# Patient Record
Sex: Male | Born: 1945 | Race: White | Hispanic: No | Marital: Married | Smoking: Current every day smoker
Health system: Southern US, Community
[De-identification: ages and names within clinical notes are randomized; demographics above are authoritative.]

## PROBLEM LIST (undated history)

## (undated) DIAGNOSIS — G20A1 Parkinson's disease without dyskinesia, without mention of fluctuations: Secondary | ICD-10-CM

## (undated) DIAGNOSIS — G20 Parkinson's disease: Secondary | ICD-10-CM

## (undated) DIAGNOSIS — I10 Essential (primary) hypertension: Secondary | ICD-10-CM

## (undated) DIAGNOSIS — J449 Chronic obstructive pulmonary disease, unspecified: Secondary | ICD-10-CM

## (undated) DIAGNOSIS — M1990 Unspecified osteoarthritis, unspecified site: Secondary | ICD-10-CM

## (undated) DIAGNOSIS — F4310 Post-traumatic stress disorder, unspecified: Secondary | ICD-10-CM

## (undated) DIAGNOSIS — E785 Hyperlipidemia, unspecified: Secondary | ICD-10-CM

## (undated) HISTORY — PX: BACK SURGERY: SHX140

---

## 2019-04-24 ENCOUNTER — Emergency Department (HOSPITAL_BASED_OUTPATIENT_CLINIC_OR_DEPARTMENT_OTHER)
Admission: EM | Admit: 2019-04-24 | Discharge: 2019-04-24 | Disposition: A | Discharge status: Home / Self Care | Payer: No Typology Code available for payment source | Attending: Emergency Medicine | Admitting: Emergency Medicine

## 2019-04-24 ENCOUNTER — Emergency Department (HOSPITAL_BASED_OUTPATIENT_CLINIC_OR_DEPARTMENT_OTHER): Payer: No Typology Code available for payment source

## 2019-04-24 ENCOUNTER — Encounter (HOSPITAL_BASED_OUTPATIENT_CLINIC_OR_DEPARTMENT_OTHER): Payer: Self-pay | Admitting: Oncology

## 2019-04-24 ENCOUNTER — Other Ambulatory Visit: Payer: Self-pay

## 2019-04-24 DIAGNOSIS — J449 Chronic obstructive pulmonary disease, unspecified: Secondary | ICD-10-CM | POA: Insufficient documentation

## 2019-04-24 DIAGNOSIS — I10 Essential (primary) hypertension: Secondary | ICD-10-CM | POA: Diagnosis not present

## 2019-04-24 DIAGNOSIS — F17210 Nicotine dependence, cigarettes, uncomplicated: Secondary | ICD-10-CM | POA: Insufficient documentation

## 2019-04-24 DIAGNOSIS — M25531 Pain in right wrist: Secondary | ICD-10-CM | POA: Insufficient documentation

## 2019-04-24 HISTORY — DX: Post-traumatic stress disorder, unspecified: F43.10

## 2019-04-24 HISTORY — DX: Unspecified osteoarthritis, unspecified site: M19.90

## 2019-04-24 HISTORY — DX: Hyperlipidemia, unspecified: E78.5

## 2019-04-24 HISTORY — DX: Chronic obstructive pulmonary disease, unspecified: J44.9

## 2019-04-24 HISTORY — DX: Parkinson's disease without dyskinesia, without mention of fluctuations: G20.A1

## 2019-04-24 HISTORY — DX: Essential (primary) hypertension: I10

## 2019-04-24 HISTORY — DX: Parkinson's disease: G20

## 2019-04-24 MED ORDER — KETOROLAC TROMETHAMINE 15 MG/ML IJ SOLN
15.0000 mg | Freq: Once | INTRAMUSCULAR | Status: AC
  Administered 2019-04-24: 15 mg via INTRAMUSCULAR
  Filled 2019-04-24: qty 1

## 2019-04-24 MED ORDER — ACETAMINOPHEN 500 MG PO TABS
1000.0000 mg | ORAL_TABLET | Freq: Once | ORAL | Status: AC
  Administered 2019-04-24: 1000 mg via ORAL
  Filled 2019-04-24: qty 2

## 2019-04-24 NOTE — ED Provider Notes (Signed)
[facility] EMERGENCY DEPARTMENT Provider Note   CSN: [id number] Arrival date & time: 04/24/19  1927     History Chief Complaint  Patient presents with  . Wrist Pain    Rickey Baker is a 74 y.o. male.  74 yo M with a chief complaint of right wrist pain.  Going on for about 4 days now.  No obvious trauma.  States that every once in a while he will bang against something due to his Parkinson's disease.  At 1 point it was swollen and reportedly red.  This is between the first and second metacarpal.  He applied warm compresses and that improved but he feels the pain is now localized more to the wrist joint.  Worse with movement and palpation and twisting.  Like he was having trouble holding his coffee cup today and so came to the ED for evaluation.  The history is provided by the patient.  Wrist Pain This is a new problem. The current episode started 2 days ago. The problem occurs constantly. The problem has not changed since onset.Pertinent negatives include no chest pain, no abdominal pain, no headaches and no shortness of breath. The symptoms are aggravated by bending and twisting. Nothing relieves the symptoms. He has tried nothing for the symptoms. The treatment provided no relief.       Past Medical History:  Diagnosis Date  . Arthritis   . COPD (chronic obstructive pulmonary disease) ([place])   . Hyperlipidemia   . Hypertension   . Parkinson's disease ([place])   . PTSD (post-traumatic stress disorder)     There are no problems to display for this patient.   Past Surgical History:  Procedure Laterality Date  . BACK SURGERY         No family history on file.  Social History   Tobacco Use  . Smoking status: Current Every Day Smoker    Packs/day: 1.00    Years: 50.00    Pack years: 50.00    Types: Cigarettes  . Smokeless tobacco: Never Used  Substance Use Topics  . Alcohol use: Not Currently  . Drug use: Not Currently    Home Medications Prior to  Admission medications   Not on File    Allergies    Patient has no known allergies.  Review of Systems   Review of Systems  Constitutional: Negative for chills and fever.  HENT: Negative for congestion and facial swelling.   Eyes: Negative for discharge and visual disturbance.  Respiratory: Negative for shortness of breath.   Cardiovascular: Negative for chest pain and palpitations.  Gastrointestinal: Negative for abdominal pain, diarrhea and vomiting.  Musculoskeletal: Positive for arthralgias. Negative for myalgias.  Skin: Negative for color change and rash.  Neurological: Negative for tremors, syncope and headaches.  Psychiatric/Behavioral: Negative for confusion and dysphoric mood.    Physical Exam Updated Vital Signs BP (!) 144/101 (BP Location: Right Arm)   Pulse 94   Temp 98 F (36.7 C) (Oral)   Resp 18   Ht 5\' 9"  (1.753 m)   Wt 81.2 kg   SpO2 96%   BMI 26.43 kg/m   Physical Exam Vitals and nursing note reviewed.  Constitutional:      Appearance: He is well-developed.  HENT:     Head: Normocephalic and atraumatic.  Eyes:     Pupils: Pupils are equal, round, and reactive to light.  Neck:     Vascular: No JVD.  Cardiovascular:     Rate and Rhythm: Normal  rate and regular rhythm.     Heart sounds: No murmur. No friction rub. No gallop.   Pulmonary:     Effort: No respiratory distress.     Breath sounds: No wheezing.  Abdominal:     General: There is no distension.     Tenderness: There is no guarding or rebound.  Musculoskeletal:        General: Normal range of motion.     Cervical back: Normal range of motion and neck supple.     Comments: Able to range the wrist without significant pain with limited motion.  Has some pain once again to about 45 degrees of dorsiflexion.  Some tenderness about the first and second metacarpal.  He has some subjective decrease sensation distal to the interphalangeal joints along the thumb.  Pulse motor and sensation are  otherwise intact.  There is no appreciable tenderness to the scaphoid.  There is no ligamentous laxity appreciated to the thumb.  Skin:    Coloration: Skin is not pale.     Findings: No rash.  Neurological:     Mental Status: He is alert and oriented to person, place, and time.  Psychiatric:        Behavior: Behavior normal.     ED Results / Procedures / Treatments   Labs (all labs ordered are listed, but only abnormal results are displayed) Labs Reviewed - No data to display  EKG None  Radiology DG Wrist Complete Right  Result Date: 04/24/2019 CLINICAL DATA:  Sudden onset right wrist pain several days ago, no known injury, initial encounter EXAM: RIGHT WRIST - COMPLETE 3+ VIEW COMPARISON:  None. FINDINGS: There is no evidence of fracture or dislocation. There is no evidence of arthropathy or other focal bone abnormality. Soft tissues are unremarkable. IMPRESSION: No acute abnormality noted. Electronically Signed   By: Alcide Clever M.D.   On: 04/24/2019 20:30   DG Hand Complete Right  Result Date: 04/24/2019 CLINICAL DATA:  Sudden onset right hand pain, no known injury, initial encounter EXAM: RIGHT HAND - COMPLETE 3+ VIEW COMPARISON:  None. FINDINGS: There is no evidence of fracture or dislocation. There is no evidence of arthropathy or other focal bone abnormality. Soft tissues are unremarkable. IMPRESSION: No acute abnormality noted. Electronically Signed   By: Alcide Clever M.D.   On: 04/24/2019 20:31    Procedures Procedures (including critical care time)  Medications Ordered in ED Medications  acetaminophen (TYLENOL) tablet 1,000 mg (1,000 mg Oral Given 04/24/19 2008)  ketorolac (TORADOL) 15 MG/ML injection 15 mg (15 mg Intramuscular Given 04/24/19 2009)    ED Course  I have reviewed the triage vital signs and the nursing notes.  Pertinent labs & imaging results that were available during my care of the patient were reviewed by me and considered in my medical decision making  (see chart for details).    MDM Rules/Calculators/A&P                      74 yo M with a chief complaint of right hand and wrist pain.  Going on for about 4 days now.  Started with some erythema and warmth but has improved.  Not warm on exam here no obvious edema clinically seems much less likely to be septic arthritis.  Will obtain screening plain films placed in a splint.  PCP follow-up.  Plain film viewed by me negative for fracture.  Discharge home.  8:48 PM:  I have discussed the diagnosis/risks/treatment options  with the patient and believe the pt to be eligible for discharge home to follow-up with PCP. We also discussed returning to the ED immediately if new or worsening sx occur. We discussed the sx which are most concerning (e.g., sudden worsening pain, fever, inability to tolerate by mouth) that necessitate immediate return. Medications administered to the patient during their visit and any new prescriptions provided to the patient are listed below.  Medications given during this visit Medications  acetaminophen (TYLENOL) tablet 1,000 mg (1,000 mg Oral Given 04/24/19 2008)  ketorolac (TORADOL) 15 MG/ML injection 15 mg (15 mg Intramuscular Given 04/24/19 2009)     The patient appears reasonably screen and/or stabilized for discharge and I doubt any other medical condition or other [facility] requiring further screening, evaluation, or treatment in the ED at this time prior to discharge.   Final Clinical Impression(s) / ED Diagnoses Final diagnoses:  Right wrist pain    Rx / DC Orders ED Discharge Orders    None       Melene Plan, DO 04/24/19 2048

## 2019-04-24 NOTE — Discharge Instructions (Signed)
Your x-ray was negative for fracture.  Wear the splint as needed for support and comfort.  Take Tylenol and ibuprofen as needed for pain.  Please return for redness fever or worsening pain.  Follow-up with your family doctor.

## 2019-04-24 NOTE — ED Triage Notes (Signed)
Pt c/o right wrist pain.  States pain started over the weekend.  Denies injury.  States he does fall often however does not remember falling over the weekend when the pain began.  Pt reports pain got progressively worse today causing him to vomit.  No obvious deformity.

## 2021-05-02 IMAGING — DX DG WRIST COMPLETE 3+V*R*
4 series · 4 of 4 positions shown · non-contrast
Comparison: None.

CLINICAL DATA: Sudden onset right wrist pain several days ago, no
known injury, initial encounter

EXAM:
RIGHT WRIST - COMPLETE 3+ VIEW

[wrist pa]
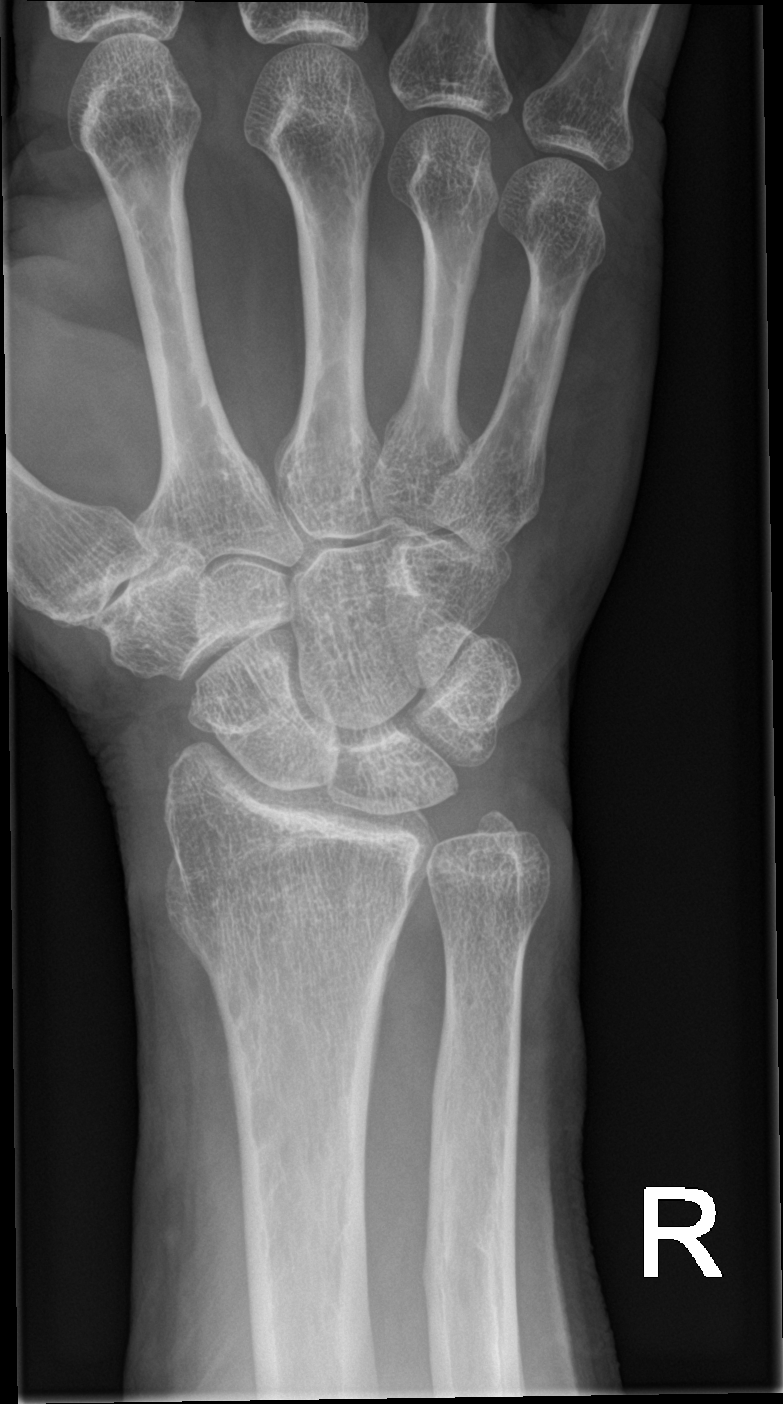

[wrist obl]
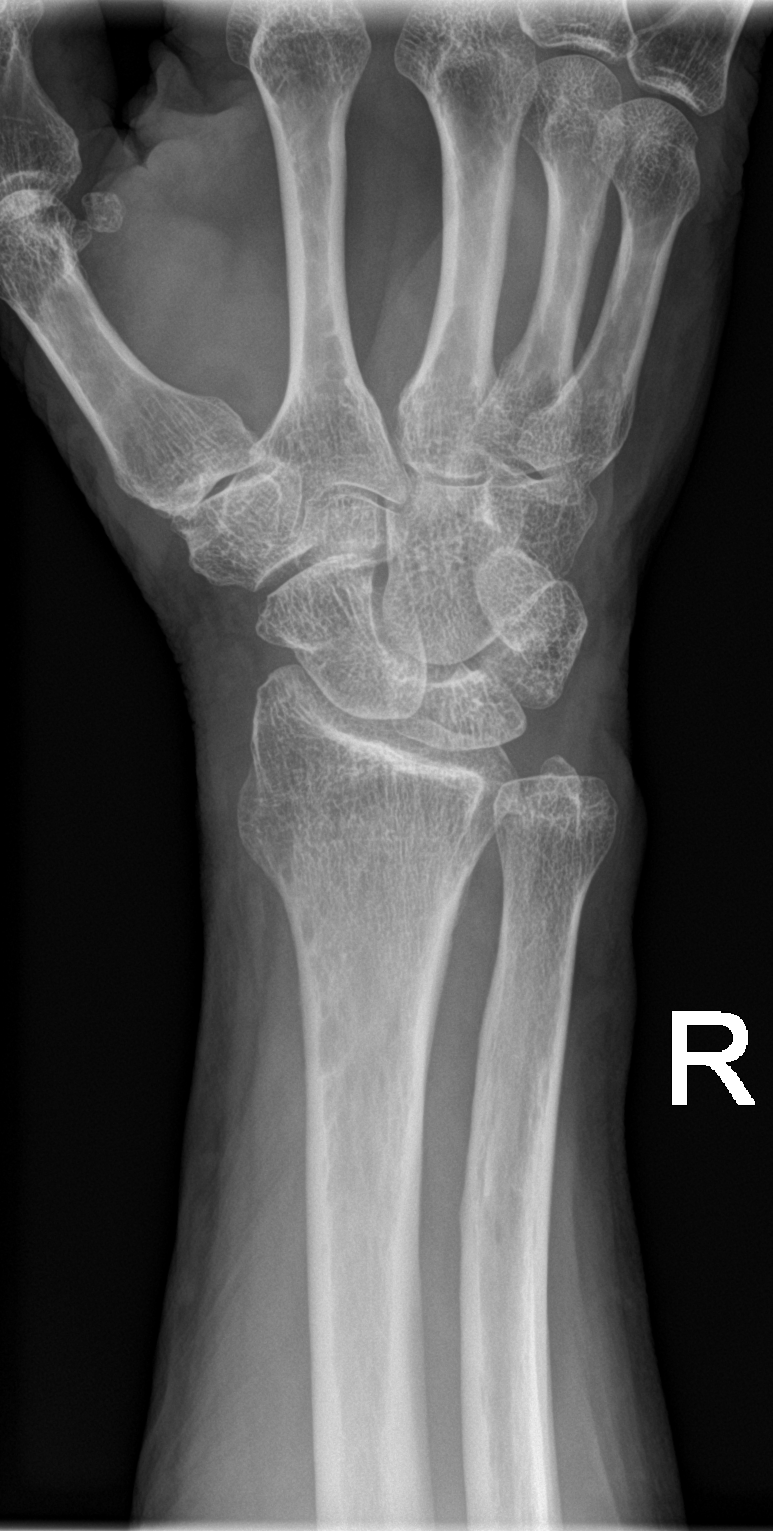

[wrist lat]
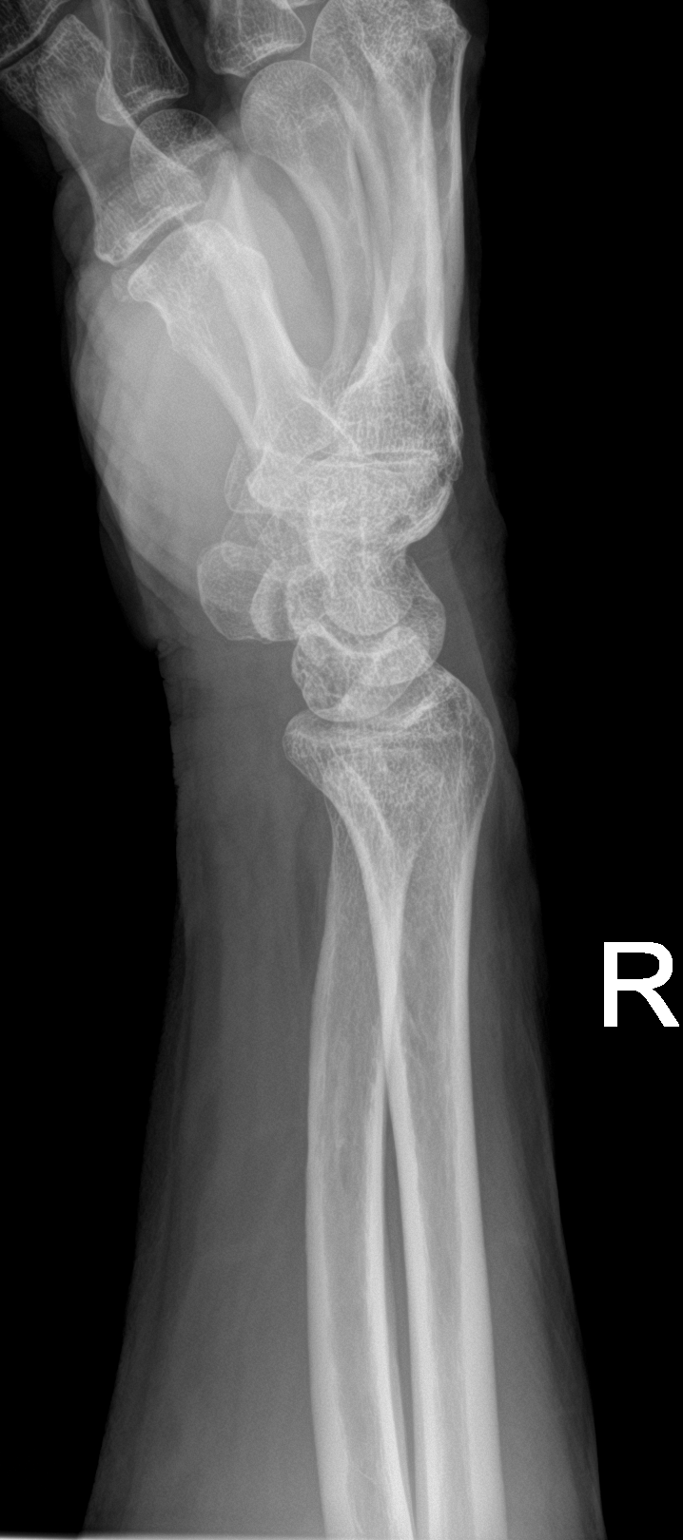

[wrist navicular]
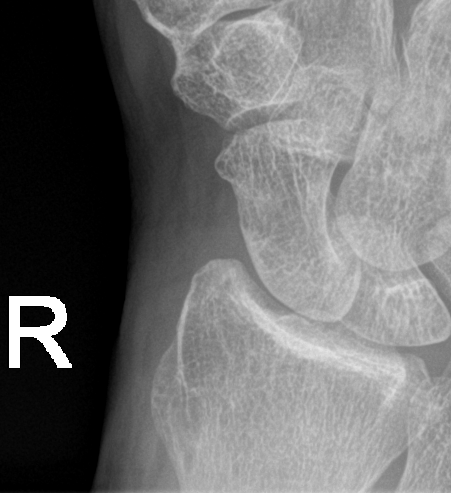

[4 of 4 positions shown; findings below may reference images not displayed]

FINDINGS: There is no evidence of fracture or dislocation. There is no
evidence of arthropathy or other focal bone abnormality. Soft
tissues are unremarkable.
IMPRESSION: No acute abnormality noted.

## 2021-07-21 ENCOUNTER — Encounter: Payer: Self-pay | Admitting: Internal Medicine

## 2021-07-21 ENCOUNTER — Ambulatory Visit (INDEPENDENT_AMBULATORY_CARE_PROVIDER_SITE_OTHER): Payer: No Typology Code available for payment source | Admitting: Internal Medicine

## 2021-07-21 VITALS — BP 124/78 | HR 101 | Temp 97.7°F | Ht 69.5 in | Wt 182.6 lb

## 2021-07-21 DIAGNOSIS — J449 Chronic obstructive pulmonary disease, unspecified: Secondary | ICD-10-CM | POA: Diagnosis not present

## 2021-07-21 DIAGNOSIS — F17210 Nicotine dependence, cigarettes, uncomplicated: Secondary | ICD-10-CM

## 2021-07-21 DIAGNOSIS — F17200 Nicotine dependence, unspecified, uncomplicated: Secondary | ICD-10-CM

## 2021-07-21 MED ORDER — BREZTRI AEROSPHERE 160-9-4.8 MCG/ACT IN AERO: 2.0000 | INHALATION_SPRAY | Freq: Two times a day (BID) | RESPIRATORY_TRACT | 5 refills | Status: DC

## 2021-07-21 MED ORDER — ALBUTEROL SULFATE (2.5 MG/3ML) 0.083% IN NEBU: 2.5000 mg | INHALATION_SOLUTION | Freq: Four times a day (QID) | RESPIRATORY_TRACT | 12 refills | Status: DC | PRN

## 2021-07-21 MED ORDER — PREDNISONE 20 MG PO TABS: 40.0000 mg | ORAL_TABLET | Freq: Every day | ORAL | 0 refills | Status: DC

## 2021-07-21 NOTE — Patient Instructions (Signed)
Please schedule follow up scheduled with myself in 3 months.  If my schedule is not open yet, we will contact you with a reminder closer to that time. Please call [phone] if you haven't heard from [place] a month before.   Stop stiolto inhaler. Switch to breztri inhaler 2 puffs twice a day.   I will send a portable nebulizer machine to a DME company to deliver to your house.   I will get your breathing testing records and CT scans from the [place].   What are the benefits of quitting smoking? Quitting smoking can lower your chances of getting or dying from heart disease, lung disease, kidney failure, infection, or cancer. It can also lower your chances of getting osteoporosis, a condition that makes your bones weak. Plus, quitting smoking can help your skin look younger and reduce the chances that you will have problems with sex.  Quitting smoking will improve your health no matter how old you are, and no matter how long or how much you have smoked.  What should I do if I want to quit smoking? The letters in the word "START" can help you remember the steps to take: S = Set a quit date. T = Tell family, friends, and the people around you that you plan to quit. A = Anticipate or plan ahead for the tough times you'll face while quitting. R = Remove cigarettes and other tobacco products from your home, car, and work. T = Talk to your doctor about getting help to quit.  How can my doctor or nurse help? Your doctor or nurse can give you advice on the best way to quit. He or she can also put you in touch with counselors or other people you can call for support. Plus, your doctor or nurse can give you medicines to: ?Reduce your craving for cigarettes ?Reduce the unpleasant symptoms that happen when you stop smoking (called "withdrawal symptoms"). You can also get help from a free phone line (1-800-QUIT-NOW) or go online to [employer].  What are the symptoms of withdrawal? The symptoms  include: ?Trouble sleeping ?Being irritable, anxious or restless ?Getting frustrated or angry ?Having trouble thinking clearly  Some people who stop smoking become temporarily depressed. Some people need treatment for depression, such as counseling or antidepressant medicines. Depressed people might: ?No longer enjoy or care about doing the things they used to like to do ?Feel sad, down, hopeless, nervous, or cranky most of the day, almost every day ?Lose or gain weight ?Sleep too much or too little ?Feel tired or like they have no energy ?Feel guilty or like they are worth nothing ?Forget things or feel confused ?Move and speak more slowly than usual ?Act restless or have trouble staying still ?Think about death or suicide  If you think you might be depressed, see your doctor or nurse. Only someone trained in mental health can tell for sure if you are depressed. If you ever feel like you might hurt yourself, go straight to the nearest emergency department. Or you can call for an ambulance (in the [place] and [place], dial 9-1-1) or call your doctor or nurse right away and tell them it is an emergency. You can also reach the [place] National Suicide Prevention Lifeline at 732 193 0488 or http://hill.com/.  How do medicines help you stop smoking? Different medicines work in different ways: ?Nicotine replacement therapy eases withdrawal and reduces your body's craving for nicotine, the main drug found in cigarettes. There are different forms of  nicotine replacement, including skin patches, lozenges, gum, nasal sprays, and "puffers" or inhalers. Many can be bought without a prescription, while others might require one. ?Bupropion is a prescription medicine that reduces your desire to smoke. This medicine is sold under the brand names Zyban and Wellbutrin. It is also available in a generic version, which is cheaper than brand name medicines. ?Varenicline (brand names: Chantix, Champix)  is a prescription medicine that reduces withdrawal symptoms and cigarette cravings. If you think you'd like to take varenicline and you have a history of depression, anxiety, or heart disease, discuss this with your doctor or nurse before taking the medicine. Varenicline can also increase the effects of alcohol in some people. It's a good idea to limit drinking while you're taking it, at least until you know how it affects you.  How does counseling work? Counseling can happen during formal office visits or just over the phone. A counselor can help you: ?Figure out what triggers your smoking and what to do instead ?Overcome cravings ?Figure out what went wrong when you tried to quit before  What works best? Studies show that people have the best luck at quitting if they take medicines to help them quit and work with a Veterinary surgeoncounselor. It might also be helpful to combine nicotine replacement with one of the prescription medicines that help people quit. In some cases, it might even make sense to take bupropion and varenicline together.  What about e-cigarettes? Sometimes people wonder if using electronic cigarettes, or "e-cigarettes," might help them quit smoking. Using e-cigarettes is also called "vaping." Doctors do not recommend e-cigarettes in place of medicines and counseling. That's because e-cigarettes still contain nicotine as well as other substances that might be harmful. It's not clear how they can affect a person's health in the long term.  Will I gain weight if I quit? Yes, you might gain a few pounds. But quitting smoking will have a much more positive effect on your health than weighing a few pounds more. Plus, you can help prevent some weight gain by being more active and eating less. Taking the medicine bupropion might help control weight gain.   What else can I do to improve my chances of quitting? You can: ?Start exercising. ?Stay away from smokers and places that you associate with  smoking. If people close to you smoke, ask them to quit with you. ?Keep gum, hard candy, or something to put in your mouth handy. If you get a craving for a cigarette, try one of these instead. ?Don't give up, even if you start smoking again. It takes most people a few tries before they succeed.  What if I am pregnant and I smoke? If you are pregnant, it's really important for the health of your baby that you quit. Ask your doctor what options you have, and what is safest for your baby  Understanding COPD   What is COPD? COPD stands for chronic obstructive pulmonary (lung) disease. COPD is a general term used for several lung diseases.  COPD is an umbrella term and encompasses other  common diseases in this group like chronic bronchitis and emphysema. Chronic asthma may also be included in this group. While some patients with COPD have only chronic bronchitis or emphysema, most patients have a combination of both.  You might hear these terms used in exchange for one another.   COPD adds to the work of the heart. Diseased lungs may reduce the amount of oxygen that goes to the blood.  High blood pressure in blood vessels from the heart to the lungs makes it difficult for the heart to pump. Lung disease can also cause the body to produce too many red blood cells which may make the blood thicker and harder to pump.   Patients who have COPD with low oxygen levels may develop an enlarged heart (cor pulmonale). This condition weakens the heart and causes increased shortness of breath and swelling in the legs and feet.   Chronic bronchitis Chronic bronchitis is irritation and inflammation (swelling) of the lining in the bronchial tubes (air passages). The irritation causes coughing and an excess amount of mucus in the airways. The swelling makes it difficult to get air in and out of the lungs. The small, hair-like structures on the inside of the airways (called cilia) may be damaged by the irritation. The  cilia are then unable to help clean mucus from the airways.  Bronchitis is generally considered to be chronic when you have: a productive cough (cough up mucus) and shortness of breath that lasts about 3 months or more each year for 2 or more years in a row. Your doctor may define chronic bronchitis differently.   Emphysema Emphysema is the destruction, or breakdown, of the walls of the alveoli (air sacs) located at the end of the bronchial tubes. The damaged alveoli are not able to exchange oxygen and carbon dioxide between the lungs and the blood. The bronchioles lose their elasticity and collapse when you exhale, trapping air in the lungs. The trapped air keeps fresh air and oxygen from entering the lungs.   Who is affected by COPD? Emphysema and chronic bronchitis affect approximately 16 million people in the [place], or close to 11 percent of the population.   Symptoms of COPD  Shortness of breath  Shortness of breath with mild exercise (walking, using the stairs, etc.)  Chronic, productive cough (with mucus)  A feeling of "tightness" in the chest  Wheezing   What causes COPD? The two primary causes of COPD are cigarette smoking and alpha1-antitrypsin (AAT) deficiency. Air pollution and occupational dusts may also contribute to COPD, especially when the person exposed to these substances is a cigarette smoker.  Cigarette smoke causes COPD by irritating the airways and creating inflammation that narrows the airways, making it more difficult to breathe. Cigarette smoke also causes the cilia to stop working properly so mucus and trapped particles are not cleaned from the airways. As a result, chronic cough and excess mucus production develop, leading to chronic bronchitis.  In some people, chronic bronchitis and infections can lead to destruction of the small airways, or emphysema.  AAT deficiency, an inherited disorder, can also lead to emphysema. Alpha antitrypsin (AAT) is a protective  material produced in the liver and transported to the lungs to help combat inflammation. When there is not enough of the chemical AAT, the body is no longer protected from an enzyme in the white blood cells.   How is COPD diagnosed?  To diagnose COPD, the physician needs to know: Do you smoke?  Have you had chronic exposure to dust or air pollutants?  Do other members of your family have lung disease?  Are you short of breath?  Do you get short of breath with exercise?  Do you have chronic cough and/or wheezing?  Do you cough up excess mucus?  To help with the diagnosis, the physician will conduct a thorough physical exam which includes:  Listening to your lungs and heart  Checking your blood pressure and pulse  Examining your nose and throat  Checking your feet and ankles for swelling   Laboratory and other tests Several laboratory and other tests are needed to confirm a diagnosis of COPD. These tests may include:  Chest X-ray to look for lung changes that could be caused by COPD   Spirometry and pulmonary function tests (PFTs) to determine lung volume and air flow  Pulse oximetry to measure the saturation of oxygen in the blood  Arterial blood gases (ABGs) to determine the amount of oxygen and carbon dioxide in the blood  Exercise testing to determine if the oxygen level in the blood drops during exercise   Treatment In the beginning stages of COPD, there is minimal shortness of breath that may be noticed only during exercise. As the disease progresses, shortness of breath may worsen and you may need to wear an oxygen device.   To help control other symptoms of COPD, the following treatments and lifestyle changes may be prescribed.  Quitting smoking  Avoiding cigarette smoke and other irritants  Taking medications including: a. bronchodilators b. anti-inflammatory agents c. oxygen d. antibiotics  Maintaining a healthy diet  Following a structured exercise program such as  pulmonary rehabilitation Preventing respiratory infections  Controlling stress   If your COPD progresses, you may be eligible to be evaluated for lung volume reduction surgery or lung transplantation. You may also be eligible to participate in certain clinical trials (research studies). Ask your health care providers about studies being conducted in your hospital.   What is the outlook? Although COPD can not be cured, its symptoms can be treated and your quality of life can be improved. Your prognosis or outlook for the future will depend on how well your lungs are functioning, your symptoms, and how well you respond to and follow your treatment plan.

## 2021-07-21 NOTE — Progress Notes (Signed)
Rickey Baker    [id number]    1945-06-10  Primary Care Physician:Pcp, No  Referring Physician: Arville Go, MD [address] [place],  [place] [address] Reason for Consultation: shortness of breath Date of Consultation: 07/21/2021  Chief complaint:   Chief Complaint  Patient presents with   Consult    He is having increased shortness of breath worse in the lat couple of months, chronic bronchitis, and some green sputum x 3 days.      HPI: Rickey Baker is a 76 y.o. man who presents for new patient evaluation of shortness of breath from the [place]. 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 He also has parkinson's disease.   He had a nebulizer machine that broke but the [place] gave him a large one. He uses spiriva once daily.  Takes albuterol 3-4 times/day.   He has had COPD for 20 years. Has had more frequent COPD exacerbations lately requiring abx and steroids.   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   His wife is younger and helps him with activities of daily living.   Hospitalized once for pneumonia many years ago, nothing recently.   He has done smoking cessation classes in the past but never quit for a sustained amount of time.   He currently feels like he is bringing up more phlegm than usual and is having worsening shortness of breath. No fevers or chills.    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  Exposures: lives at home with with his wife.  Smoking history: 60 x 2 ppd = 120 pack years, currently down to 1 ppd  Social History   Occupational History   Not on file  Tobacco Use   Smoking status: Every Day    Packs/day: 1.00    Years: 50.00    Pack years: 50.00    Types: Cigarettes   Smokeless tobacco: Never  Vaping Use   Vaping Use: Never used  Substance and Sexual Activity    Alcohol use: Not Currently   Drug use: Not Currently   Sexual activity: Not on file    Relevant family history:  Family History  Problem Relation Age of Onset   Asthma Son     Past Medical History:  Diagnosis Date   Arthritis    COPD (chronic obstructive pulmonary disease) (HCC)    Hyperlipidemia    Hypertension    Parkinson's disease (HCC)    PTSD (post-traumatic stress disorder)     Past Surgical History:  Procedure Laterality Date   BACK SURGERY       Physical Exam: Blood pressure 124/78, pulse (!) 101, temperature 97.7 F (36.5 C), temperature source Oral, height 5' 9.5" (1.765 m), weight 182 lb 9.6 oz (82.8 kg), SpO2 95 %. Gen:      No acute distress, purse-lipped breathing ENT:  no nasal polyps, mucus membranes moist Lungs:    diminished, no wheezes or crackles.  CV:         tachycardic, regular, no murmurs, rubs, or gallops.  No pedal edema Abd:      + bowel sounds; soft, non-tender; no distension MSK: no acute synovitis of DIP or PIP joints, no mechanics hands.  Skin:      Warm and dry; no  rashes Neuro: normal speech, no focal facial asymmetry Psych: alert and oriented x3, normal mood and affect   Data Reviewed/Medical Decision Making:  Independent interpretation of tests: Imaging: CT scan from Jun2 2022\ shows emphysema and LLL nodule 12x59mm unchanged since September 2021  PFTs: I have personally reviewed the patient's PFTs and      View : No data to display.          Labs: No results found for: WBC, HGB, HCT, MCV, PLT No results found for: NA, K, CL, CO2   Immunization status:  Immunization History  Administered Date(s) Administered   DT (Pediatric) 09/20/1995, 10/14/1997   Fluad Quad(high Dose 65+) 11/29/2019, 11/23/2020   H1N1 01/24/2008   Influenza Whole 12/02/1997, 12/04/1998, 11/28/2000   Influenza, High Dose Seasonal PF 12/03/2014, 11/23/2016, 12/05/2018   Influenza, Seasonal, Injecte, Preservative Fre 11/20/2013, 11/20/2015    Influenza-Unspecified 12/27/1994, 12/09/1996, 11/30/1999, 02/21/2001, 10/22/2001, 11/05/2001, 02/22/2003, 12/05/2003, 10/11/2004, 11/30/2005, 11/21/2006, 11/22/2007, 11/27/2008, 11/18/2009, 11/22/2010, 12/01/2011, 01/19/2013, 11/07/2017   Moderna Covid-19 Vaccine Bivalent Booster 71yrs & up 07/02/2021   PFIZER Comirnaty(Gray Top)Covid-19 Tri-Sucrose Vaccine 12/10/2019   PFIZER(Purple Top)SARS-COV-2 Vaccination 04/04/2019, 04/26/2019, 12/10/2019   Pneumococcal Conjugate-13 08/19/2014, 10/31/2017   Pneumococcal Polysaccharide-23 08/30/2019   Pneumococcal-Unspecified 11/22/2007   Tdap 04/12/2012, 11/20/2015   Zoster Recombinat (Shingrix) 08/30/2019, 11/29/2019   Zoster, Live 04/30/2013, 01/22/2015     I reviewed prior external note(s) from [place]  I reviewed the result(s) of the labs and imaging as noted above.   I have ordered PFT   Assessment:  Severe COPD with acute exacerbation Lung Nodule   Plan/Recommendations: Stop stiolto. Switch to breztri Prednisone for exacerbation  Will prescribe portable nebulizer machine.  He has had pfts in the last year at the [place]. Will obtain these records.  Due for CT scan in a few months at the [place] to follow up on SPN. Will obtain those results as well.   Smoking Cessation Counseling:  1. The patient is an everyday smoker and symptomatic due to the following condition COPD 2. The patient is currently pre-contemplative in quitting smoking. 3. I advised patient to quit smoking. 4. We identified patient specific barriers to change.  5. I personally spent 3 minutes counseling the patient regarding tobacco use disorder. 6. We discussed management of stress and anxiety to help with smoking cessation, when applicable. 7. We discussed nicotine replacement therapy, Wellbutrin, Chantix as possible options. 8. I advised setting a quit date. 9. Follow?up arranged with our office to continue ongoing discussions. 10.Resources given to patient including quit  hotline.    We discussed disease management and progression at length today.     Return to Care: Return in about 3 months (around 10/21/2021).  Durel Salts, MD Pulmonary and Critical Care Medicine [facility] Office:[phone]  CC: Arville Go, MD

## 2021-10-27 ENCOUNTER — Ambulatory Visit: Payer: No Typology Code available for payment source | Admitting: Internal Medicine

## 2022-05-18 ENCOUNTER — Ambulatory Visit: Payer: No Typology Code available for payment source | Admitting: Internal Medicine

## 2022-06-30 ENCOUNTER — Ambulatory Visit: Payer: No Typology Code available for payment source | Admitting: Internal Medicine

## 2022-07-14 ENCOUNTER — Encounter: Payer: Self-pay | Admitting: Internal Medicine

## 2022-10-07 ENCOUNTER — Telehealth: Payer: Self-pay

## 2022-10-07 ENCOUNTER — Other Ambulatory Visit
Admission: RE | Admit: 2022-10-07 | Discharge: 2022-10-07 | Disposition: A | Discharge status: Home / Self Care | Payer: No Typology Code available for payment source | Source: Ambulatory Visit | Attending: Nurse Practitioner | Admitting: Nurse Practitioner

## 2022-10-07 ENCOUNTER — Telehealth: Payer: Self-pay | Admitting: Nurse Practitioner

## 2022-10-07 ENCOUNTER — Ambulatory Visit (INDEPENDENT_AMBULATORY_CARE_PROVIDER_SITE_OTHER): Payer: No Typology Code available for payment source | Admitting: Nurse Practitioner

## 2022-10-07 ENCOUNTER — Encounter: Payer: Self-pay | Admitting: Nurse Practitioner

## 2022-10-07 ENCOUNTER — Ambulatory Visit
Admission: RE | Admit: 2022-10-07 | Discharge: 2022-10-07 | Disposition: A | Discharge status: Home / Self Care | Payer: No Typology Code available for payment source | Source: Ambulatory Visit | Attending: Nurse Practitioner | Admitting: Nurse Practitioner

## 2022-10-07 VITALS — BP 128/76 | HR 115 | Temp 98.0°F | Ht 69.5 in | Wt 200.0 lb

## 2022-10-07 DIAGNOSIS — J9601 Acute respiratory failure with hypoxia: Secondary | ICD-10-CM | POA: Diagnosis present

## 2022-10-07 DIAGNOSIS — R042 Hemoptysis: Secondary | ICD-10-CM | POA: Diagnosis present

## 2022-10-07 DIAGNOSIS — J441 Chronic obstructive pulmonary disease with (acute) exacerbation: Secondary | ICD-10-CM | POA: Diagnosis not present

## 2022-10-07 DIAGNOSIS — J9600 Acute respiratory failure, unspecified whether with hypoxia or hypercapnia: Secondary | ICD-10-CM | POA: Insufficient documentation

## 2022-10-07 MED ORDER — ALBUTEROL SULFATE (2.5 MG/3ML) 0.083% IN NEBU
2.5000 mg | INHALATION_SOLUTION | Freq: Once | RESPIRATORY_TRACT | Status: AC
  Administered 2022-10-07: 2.5 mg via RESPIRATORY_TRACT

## 2022-10-07 MED ORDER — BREZTRI AEROSPHERE 160-9-4.8 MCG/ACT IN AERO: 2.0000 | INHALATION_SPRAY | Freq: Two times a day (BID) | RESPIRATORY_TRACT | 12 refills | Status: DC

## 2022-10-07 MED ORDER — AMOXICILLIN-POT CLAVULANATE 875-125 MG PO TABS: 1.0000 | ORAL_TABLET | Freq: Two times a day (BID) | ORAL | 0 refills | Status: AC

## 2022-10-07 MED ORDER — IOHEXOL 350 MG/ML SOLN
75.0000 mL | Freq: Once | INTRAVENOUS | Status: AC | PRN
  Administered 2022-10-07: 75 mL via INTRAVENOUS

## 2022-10-07 MED ORDER — PREDNISONE 10 MG PO TABS: ORAL_TABLET | ORAL | 0 refills | Status: DC

## 2022-10-07 NOTE — Telephone Encounter (Signed)
Lm x1 for patient.  

## 2022-10-07 NOTE — Progress Notes (Signed)
@Patient  ID: Luna Fuse, male    DOB: 07-19-1945, 77 y.o.   MRN: [id number]  Chief Complaint  Patient presents with   Follow-up    Increased SOB with exertion and prod cough with clear to dark red sputum     Referring provider: Domingo Pulse, PA-C  HPI: 77 year old male, active smoker followed for severe COPD.  He is a patient Dr. Humphrey Rolls and last seen in office 07/21/2021.  Past medical history significant for HTN, Parkinson's, arthritis, PTSD, HLD.   TEST/EVENTS:   07/21/2021: OV with Dr. Celine Mans for consult.  Referred by the [facility] for shortness of breath.  Has a history of everyday tobacco use and COPD.  Also has Parkinson's disease.  Does have a [facility].  Uses Spiriva once daily.  Takes albuterol 3-4 times a day.  Has had COPD for 20 years.  Has had more frequent COPD exacerbations lately requiring antibiotics and steroids.  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  Wife is younger and helps him with activities of daily living.  Hospitalized once for pneumonia many years ago, nothing recently.  Has done smoking cessation class in the past but never quit for sustained amount of time.  Currently feels like he is bringing up more phlegm than usual and having more shortness of breath.  Advised to stop Spiriva and switch to [employer].  Treated with prednisone for exacerbation.  Prescribed portable nebulizer machine.  Repeat PFTs.  Due for CT scan in a few months at the [place] to follow-up on lung nodule. 3 month f/u.   10/07/2022: Today - acute Patient presents today for acute visit.  He was seen at the [facility] yesterday and noted to have low oxygen levels down to 81%.  They did start him on oxygen during his visit but did not send him home with any.  He says he felt much better when he was on it.  He was advised to follow-up with pulmonary to have oxygen prescribed and for further evaluation.  He tells me today that he has had symptoms for 3 weeks.  He has  had a productive cough with brown and occasionally dark red phlegm.  He has had more shortness of breath.  Feels like he cannot do anything without getting winded.  He has noticed more wheezing.  Has a lot of chest congestion.  Has been having some sweats during the day and at night. Denies any fevers, chills, leg swelling, calf pain, orthopnea, chest pain, palpitations, weight loss.  Eating and drinking well.  He did have a chest x-ray done at the [place] which she was told was clear.  He has Markus Daft that he uses twice a day but he recently ran out of his inhaler yesterday.  Does have albuterol nebulizers and rescue at home.  Has been using this couple times a day.  PFTs and previous imaging not available.  Allergies  Allergen Reactions   Indomethacin Other (See Comments)    Other reaction(s): Other (See Comments) Ear ringing     Immunization History  Administered Date(s) Administered   Covid-19, Mrna,Vaccine(Spikevax)63yrs and older 12/01/2021   DT (Pediatric) 09/20/1995, 10/14/1997   Fluad Quad(high Dose 65+) 11/29/2019, 11/23/2020   H1N1 01/24/2008   Influenza Whole 12/02/1997, 12/04/1998, 11/28/2000   Influenza, High Dose Seasonal PF 12/03/2014, 11/23/2016, 12/05/2018   Influenza, Seasonal, Injecte, Preservative Fre 11/20/2013, 11/20/2015   Influenza-Unspecified 12/27/1994, 12/09/1996, 11/30/1999, 02/21/2001, 10/22/2001, 11/05/2001, 02/22/2003, 12/05/2003, 10/11/2004, 11/30/2005,  11/21/2006, 11/22/2007, 11/27/2008, 11/18/2009, 11/22/2010, 12/01/2011, 01/19/2013, 11/07/2017, 12/05/2018, 11/19/2021   Moderna Covid-19 Vaccine Bivalent Booster 63yrs & up 07/02/2021   PFIZER Comirnaty(Gray Top)Covid-19 Tri-Sucrose Vaccine 12/10/2019   PFIZER(Purple Top)SARS-COV-2 Vaccination 04/04/2019, 04/26/2019, 12/10/2019   Pneumococcal Conjugate-13 08/19/2014, 10/31/2017   Pneumococcal Polysaccharide-23 08/30/2019   Pneumococcal-Unspecified 11/22/2007   Tdap 04/12/2012, 11/20/2015   Zoster  Recombinant(Shingrix) 08/30/2019, 11/29/2019   Zoster, Live 04/30/2013, 01/22/2015    Past Medical History:  Diagnosis Date   Arthritis    COPD (chronic obstructive pulmonary disease) (HCC)    Hyperlipidemia    Hypertension    Parkinson's disease    PTSD (post-traumatic stress disorder)     Tobacco History: Social History   Tobacco Use  Smoking Status Every Day   Current packs/day: 1.00   Average packs/day: 1 pack/day for 50.0 years (50.0 ttl pk-yrs)   Types: Cigarettes  Smokeless Tobacco Never  Tobacco Comments   0.5ppd 10/07/2022   Ready to quit: Not Answered Counseling given: Not Answered Tobacco comments: 0.5ppd 10/07/2022   Outpatient Medications Prior to Visit  Medication Sig Dispense Refill   albuterol (PROVENTIL) (2.5 MG/3ML) 0.083% nebulizer solution Take 3 mLs (2.5 mg total) by nebulization every 6 (six) hours as needed for wheezing or shortness of breath. 75 mL 12   albuterol (VENTOLIN HFA) 108 (90 Base) MCG/ACT inhaler INHALE 2 PUFFS BY MOUTH FOUR TIMES A DAY AS NEEDED FOR WHEEZING, SHORTNESS OF BREATHE, AND COUGH     carbidopa-levodopa (SINEMET IR) 10-100 MG tablet Take 1 tablet by mouth 3 (three) times daily.     celecoxib (CELEBREX) 200 MG capsule Take 1 tablet by mouth daily.     ezetimibe (ZETIA) 10 MG tablet TAKE ONE-HALF TABLET BY MOUTH DAILY FOR CHOLESTEROL CONTROL     gabapentin (NEURONTIN) 600 MG tablet Take by mouth.     hydrOXYzine (ATARAX) 10 MG tablet Take by mouth.     metoprolol succinate (TOPROL-XL) 50 MG 24 hr tablet Take by mouth.     omeprazole (PRILOSEC) 20 MG capsule Take by mouth.     pyridoxine (B-6) 100 MG tablet Take by mouth.     ranitidine (ZANTAC) 300 MG capsule Take by mouth.     rOPINIRole (REQUIP) 0.25 MG tablet Take by mouth.     topiramate (TOPAMAX) 100 MG tablet Take by mouth.     Budeson-Glycopyrrol-Formoterol (BREZTRI AEROSPHERE) 160-9-4.8 MCG/ACT AERO Inhale 2 puffs into the lungs in the morning and at bedtime. 1 each 5    predniSONE (DELTASONE) 20 MG tablet Take 2 tablets (40 mg total) by mouth daily with breakfast. 10 tablet 0   No facility-administered medications prior to visit.     Review of Systems:   Constitutional: No weight loss or gain, fevers, chills, or lassitude. +fatigue, night sweats  HEENT: No headaches, difficulty swallowing, tooth/dental problems, or sore throat. No sneezing, itching, ear ache, nasal congestion, or post nasal drip CV:  No chest pain, orthopnea, PND, swelling in lower extremities, anasarca, dizziness, palpitations, syncope Resp: + shortness of breath with exertion; productive cough; hemoptysis; wheezing, chest congestion. No chest wall deformity GI:  No heartburn, indigestion, abdominal pain, nausea, vomiting, diarrhea, change in bowel habits, loss of appetite, bloody stools.  GU: No dysuria, change in color of urine, urgency or frequency.  No flank pain, no hematuria  Skin: No rash, lesions, ulcerations MSK:  No joint pain or swelling.   Neuro: No dizziness or lightheadedness.  Psych: No depression or anxiety. Mood stable.     Physical Exam:  BP 128/76 (BP Location: Left Arm, Cuff Size: Normal)   Pulse (!) 115   Temp 98 F (36.7 C) (Temporal)   Ht 5' 9.5" (1.765 m)   Wt 200 lb (90.7 kg)   SpO2 91%   BMI 29.11 kg/m   GEN: Pleasant, interactive, chronically-ill appearing; in no acute distress. HEENT:  Normocephalic and atraumatic. PERRLA. Sclera white. Nasal turbinates pink, moist and patent bilaterally. No rhinorrhea present. Oropharynx pink and moist, without exudate or edema. No lesions, ulcerations, or postnasal drip.  NECK:  Supple w/ fair ROM. No JVD present. Normal carotid impulses w/o bruits. Thyroid symmetrical with no goiter or nodules palpated. No lymphadenopathy.   CV: Tachycardia, regular rhythm, no m/r/g, no peripheral edema. Pulses intact, +2 bilaterally. No cyanosis, pallor or clubbing. PULMONARY:  Increased work of breathing. Diminished airflow  with expiratory wheezes bilaterally A&P. No accessory muscle use.  GI: BS present and normoactive. Soft, non-tender to palpation. No organomegaly or masses detected.  MSK: No erythema, warmth or tenderness. Cap refil <2 sec all extrem. No deformities or joint swelling noted.  Neuro: A/Ox3. No focal deficits noted.   Skin: Warm, no lesions or rashe Psych: Normal affect and behavior. Judgement and thought content appropriate.     Lab Results:  CBC No results found for: "WBC", "RBC", "HGB", "HCT", "PLT", "MCV", "MCH", "MCHC", "RDW", "LYMPHSABS", "MONOABS", "EOSABS", "BASOSABS"  BMET No results found for: "NA", "K", "CL", "CO2", "GLUCOSE", "BUN", "CREATININE", "CALCIUM", "GFRNONAA", "GFRAA"  BNP No results found for: "BNP"   Imaging:  No results found.  albuterol (PROVENTIL) (2.5 MG/3ML) 0.083% nebulizer solution 2.5 mg     Date Action Dose Route User   10/07/2022 1004 Given 2.5 mg Nebulization Smith, Margie A, CMA           No data to display          No results found for: "NITRICOXIDE"      Assessment & Plan:   Acute respiratory failure (HCC) New onset hypoxemia with desaturations to 81% at the [place]. He was not started on oxygen. Walk test today with desaturation to 87%. Supplemental oxygen was then applied; required 4 lpm to maintain >88-90%. Urgent new start orders placed. Per his report, CXR was clear. Given his symptoms, will empirically treat him with augmentin 7 day course and prednisone taper for AECOPD. With his hemoptysis, dyspnea, hypoxia, and tachycardia, will need to rule out PE with STAT CTA chest. This will also allow [place] to assess for infectious/inflammatory process. Check CBC with diff and BNP today. Close follow up and strict ED precautions.  Patient Instructions  Continue Albuterol inhaler 2 puffs or 3 mL neb every 6 hours as needed for shortness of breath or wheezing. Notify if symptoms persist despite rescue inhaler/neb use. Use nebs three times a day  until symptoms improve Continue Breztri 2 puffs Twice daily. Brush tongue and rinse mouth afterwards  Augmentin 1 tab Twice daily for 7 days. Take with food Prednisone taper. 4 tabs for 3 days, then 3 tabs for 3 days, 2 tabs for 3 days, then 1 tab for 3 days, then stop. Take in AM with food  Guaifenesin [phone] mg Twice daily for cough/chest congestion  Start wearing oxygen 4 lpm for goal >88-90%. If you cannot maintain your oxygen levels on this, you need to go to the emergency department   STAT CTA chest to rule out a blood clot in your lung  Labs today  Follow up in 7-10 days in [place] with Dr.  Desai (1st) or Katie Brett Darko,NP. Ok to double book with [facility]. If symptoms do not improve or worsen, please contact office for sooner follow up or seek emergency care.    COPD with acute exacerbation (HCC) AECOPD with acute respiratory failure. See above. Maximize bronchodilator and mucociliary clearance therapies. Breztri refilled. Action plan in place.    I spent 60 minutes of dedicated to the care of this patient on the date of this encounter to include pre-visit review of records, face-to-face time with the patient discussing conditions above, post visit ordering of testing, clinical documentation with the electronic health record, making appropriate referrals as documented, and communicating necessary findings to members of the patients care team.  Noemi Chapel, NP 10/07/2022  Pt aware and understands NP's role.

## 2022-10-07 NOTE — Patient Instructions (Addendum)
Continue Albuterol inhaler 2 puffs or 3 mL neb every 6 hours as needed for shortness of breath or wheezing. Notify if symptoms persist despite rescue inhaler/neb use. Use nebs three times a day until symptoms improve Continue Breztri 2 puffs Twice daily. Brush tongue and rinse mouth afterwards  Augmentin 1 tab Twice daily for 7 days. Take with food Prednisone taper. 4 tabs for 3 days, then 3 tabs for 3 days, 2 tabs for 3 days, then 1 tab for 3 days, then stop. Take in AM with food  Guaifenesin [phone] mg Twice daily for cough/chest congestion  Start wearing oxygen 4 lpm for goal >88-90%. If you cannot maintain your oxygen levels on this, you need to go to the emergency department   STAT CTA chest to rule out a blood clot in your lung  Labs today  Follow up in 7-10 days in [place] with Dr. Celine Mans (1st) or Katie Noora Locascio,NP. Ok to double book with [facility]. If symptoms do not improve or worsen, please contact office for sooner follow up or seek emergency care.

## 2022-10-07 NOTE — Assessment & Plan Note (Signed)
AECOPD with acute respiratory failure. See above. Maximize bronchodilator and mucociliary clearance therapies. Breztri refilled. Action plan in place.

## 2022-10-07 NOTE — Telephone Encounter (Signed)
Kia from [facility] [place] has called in requesting Pt OV Notes from 10/07/2022 and Order for oxygen notes. Faxed over notes to Kia at the [facility] [place] at [phone]

## 2022-10-07 NOTE — Telephone Encounter (Addendum)
Patient scheduled for 1wk rov 10/17/2022 at 2:30. Okay per [facility] verbally to double book.  Patient currently having CT. Will call later to confirm appt.

## 2022-10-07 NOTE — Assessment & Plan Note (Signed)
New onset hypoxemia with desaturations to 81% at the [place]. He was not started on oxygen. Walk test today with desaturation to 87%. Supplemental oxygen was then applied; required 4 lpm to maintain >88-90%. Urgent new start orders placed. Per his report, CXR was clear. Given his symptoms, will empirically treat him with augmentin 7 day course and prednisone taper for AECOPD. With his hemoptysis, dyspnea, hypoxia, and tachycardia, will need to rule out PE with STAT CTA chest. This will also allow [place] to assess for infectious/inflammatory process. Check CBC with diff and BNP today. Close follow up and strict ED precautions.  Patient Instructions  Continue Albuterol inhaler 2 puffs or 3 mL neb every 6 hours as needed for shortness of breath or wheezing. Notify if symptoms persist despite rescue inhaler/neb use. Use nebs three times a day until symptoms improve Continue Breztri 2 puffs Twice daily. Brush tongue and rinse mouth afterwards  Augmentin 1 tab Twice daily for 7 days. Take with food Prednisone taper. 4 tabs for 3 days, then 3 tabs for 3 days, 2 tabs for 3 days, then 1 tab for 3 days, then stop. Take in AM with food  Guaifenesin [phone] mg Twice daily for cough/chest congestion  Start wearing oxygen 4 lpm for goal >88-90%. If you cannot maintain your oxygen levels on this, you need to go to the emergency department   STAT CTA chest to rule out a blood clot in your lung  Labs today  Follow up in 7-10 days in [place] with Dr. Celine Mans (1st) or Katie Marcanthony Sleight,NP. Ok to double book with [facility]. If symptoms do not improve or worsen, please contact office for sooner follow up or seek emergency care.

## 2022-10-10 NOTE — Telephone Encounter (Signed)
Lm x2 for patient. Will call once more due to nature of call.   

## 2022-10-10 NOTE — Progress Notes (Signed)
10/07/2022: Discussed results with pt over the phone. Advised to continue treatment for AECOPD and keep close follow up. Verbalized understanding.

## 2022-10-11 NOTE — Telephone Encounter (Signed)
Lm x3 for patient.  Letter mailed to address on file.  Will close encounter per office protocol. Nothing further needed.

## 2022-10-17 ENCOUNTER — Ambulatory Visit: Payer: No Typology Code available for payment source | Admitting: Nurse Practitioner

## 2023-06-27 ENCOUNTER — Emergency Department (HOSPITAL_COMMUNITY)

## 2023-06-27 ENCOUNTER — Encounter (HOSPITAL_COMMUNITY)
Admission: EM | Disposition: A | Discharge status: Nursing Home (Includes ICF) | Payer: Self-pay | Source: Home / Self Care | Attending: Neurology

## 2023-06-27 ENCOUNTER — Inpatient Hospital Stay (HOSPITAL_COMMUNITY): Admitting: Anesthesiology

## 2023-06-27 ENCOUNTER — Inpatient Hospital Stay (HOSPITAL_COMMUNITY)

## 2023-06-27 ENCOUNTER — Inpatient Hospital Stay (HOSPITAL_COMMUNITY)
Admission: EM | Admit: 2023-06-27 | Discharge: 2023-07-08 | DRG: 023 | Disposition: A | Discharge status: Other Acute Inpatient Hospital | Attending: Pulmonary Disease | Admitting: Pulmonary Disease

## 2023-06-27 DIAGNOSIS — I63232 Cerebral infarction due to unspecified occlusion or stenosis of left carotid arteries: Secondary | ICD-10-CM | POA: Diagnosis not present

## 2023-06-27 DIAGNOSIS — J9621 Acute and chronic respiratory failure with hypoxia: Secondary | ICD-10-CM | POA: Diagnosis not present

## 2023-06-27 DIAGNOSIS — Z6826 Body mass index (BMI) 26.0-26.9, adult: Secondary | ICD-10-CM | POA: Diagnosis not present

## 2023-06-27 DIAGNOSIS — R29810 Facial weakness: Secondary | ICD-10-CM | POA: Diagnosis present

## 2023-06-27 DIAGNOSIS — G8191 Hemiplegia, unspecified affecting right dominant side: Secondary | ICD-10-CM | POA: Diagnosis present

## 2023-06-27 DIAGNOSIS — J441 Chronic obstructive pulmonary disease with (acute) exacerbation: Secondary | ICD-10-CM | POA: Diagnosis not present

## 2023-06-27 DIAGNOSIS — I10 Essential (primary) hypertension: Secondary | ICD-10-CM

## 2023-06-27 DIAGNOSIS — I959 Hypotension, unspecified: Secondary | ICD-10-CM | POA: Diagnosis not present

## 2023-06-27 DIAGNOSIS — Z7189 Other specified counseling: Secondary | ICD-10-CM | POA: Diagnosis not present

## 2023-06-27 DIAGNOSIS — E1159 Type 2 diabetes mellitus with other circulatory complications: Secondary | ICD-10-CM | POA: Diagnosis present

## 2023-06-27 DIAGNOSIS — J150 Pneumonia due to Klebsiella pneumoniae: Secondary | ICD-10-CM | POA: Diagnosis not present

## 2023-06-27 DIAGNOSIS — F4310 Post-traumatic stress disorder, unspecified: Secondary | ICD-10-CM | POA: Diagnosis present

## 2023-06-27 DIAGNOSIS — R4701 Aphasia: Secondary | ICD-10-CM | POA: Diagnosis present

## 2023-06-27 DIAGNOSIS — Z930 Tracheostomy status: Secondary | ICD-10-CM | POA: Diagnosis not present

## 2023-06-27 DIAGNOSIS — J9690 Respiratory failure, unspecified, unspecified whether with hypoxia or hypercapnia: Secondary | ICD-10-CM | POA: Diagnosis not present

## 2023-06-27 DIAGNOSIS — I63412 Cerebral infarction due to embolism of left middle cerebral artery: Principal | ICD-10-CM | POA: Diagnosis present

## 2023-06-27 DIAGNOSIS — E440 Moderate protein-calorie malnutrition: Secondary | ICD-10-CM | POA: Diagnosis present

## 2023-06-27 DIAGNOSIS — I6522 Occlusion and stenosis of left carotid artery: Secondary | ICD-10-CM

## 2023-06-27 DIAGNOSIS — Z95828 Presence of other vascular implants and grafts: Secondary | ICD-10-CM | POA: Diagnosis not present

## 2023-06-27 DIAGNOSIS — K219 Gastro-esophageal reflux disease without esophagitis: Secondary | ICD-10-CM | POA: Diagnosis present

## 2023-06-27 DIAGNOSIS — F17210 Nicotine dependence, cigarettes, uncomplicated: Secondary | ICD-10-CM

## 2023-06-27 DIAGNOSIS — E870 Hyperosmolality and hypernatremia: Secondary | ICD-10-CM | POA: Diagnosis not present

## 2023-06-27 DIAGNOSIS — A419 Sepsis, unspecified organism: Secondary | ICD-10-CM | POA: Diagnosis not present

## 2023-06-27 DIAGNOSIS — J9600 Acute respiratory failure, unspecified whether with hypoxia or hypercapnia: Secondary | ICD-10-CM | POA: Diagnosis not present

## 2023-06-27 DIAGNOSIS — J449 Chronic obstructive pulmonary disease, unspecified: Secondary | ICD-10-CM | POA: Diagnosis not present

## 2023-06-27 DIAGNOSIS — Z825 Family history of asthma and other chronic lower respiratory diseases: Secondary | ICD-10-CM

## 2023-06-27 DIAGNOSIS — Z7982 Long term (current) use of aspirin: Secondary | ICD-10-CM

## 2023-06-27 DIAGNOSIS — R509 Fever, unspecified: Secondary | ICD-10-CM | POA: Diagnosis not present

## 2023-06-27 DIAGNOSIS — I6389 Other cerebral infarction: Secondary | ICD-10-CM | POA: Diagnosis not present

## 2023-06-27 DIAGNOSIS — I152 Hypertension secondary to endocrine disorders: Secondary | ICD-10-CM | POA: Diagnosis present

## 2023-06-27 DIAGNOSIS — Z888 Allergy status to other drugs, medicaments and biological substances status: Secondary | ICD-10-CM

## 2023-06-27 DIAGNOSIS — J690 Pneumonitis due to inhalation of food and vomit: Secondary | ICD-10-CM | POA: Diagnosis not present

## 2023-06-27 DIAGNOSIS — N179 Acute kidney failure, unspecified: Secondary | ICD-10-CM | POA: Diagnosis not present

## 2023-06-27 DIAGNOSIS — G20B2 Parkinson's disease with dyskinesia, with fluctuations: Secondary | ICD-10-CM | POA: Diagnosis not present

## 2023-06-27 DIAGNOSIS — E1165 Type 2 diabetes mellitus with hyperglycemia: Secondary | ICD-10-CM | POA: Diagnosis present

## 2023-06-27 DIAGNOSIS — R29724 NIHSS score 24: Secondary | ICD-10-CM | POA: Diagnosis present

## 2023-06-27 DIAGNOSIS — R739 Hyperglycemia, unspecified: Secondary | ICD-10-CM | POA: Diagnosis not present

## 2023-06-27 DIAGNOSIS — E119 Type 2 diabetes mellitus without complications: Secondary | ICD-10-CM

## 2023-06-27 DIAGNOSIS — I779 Disorder of arteries and arterioles, unspecified: Secondary | ICD-10-CM | POA: Diagnosis not present

## 2023-06-27 DIAGNOSIS — R609 Edema, unspecified: Secondary | ICD-10-CM | POA: Diagnosis not present

## 2023-06-27 DIAGNOSIS — I63319 Cerebral infarction due to thrombosis of unspecified middle cerebral artery: Secondary | ICD-10-CM | POA: Diagnosis not present

## 2023-06-27 DIAGNOSIS — Z515 Encounter for palliative care: Secondary | ICD-10-CM | POA: Diagnosis not present

## 2023-06-27 DIAGNOSIS — K5900 Constipation, unspecified: Secondary | ICD-10-CM | POA: Diagnosis not present

## 2023-06-27 DIAGNOSIS — I69391 Dysphagia following cerebral infarction: Secondary | ICD-10-CM | POA: Diagnosis not present

## 2023-06-27 DIAGNOSIS — H5347 Heteronymous bilateral field defects: Secondary | ICD-10-CM | POA: Diagnosis present

## 2023-06-27 DIAGNOSIS — J9601 Acute respiratory failure with hypoxia: Secondary | ICD-10-CM | POA: Diagnosis present

## 2023-06-27 DIAGNOSIS — I63522 Cerebral infarction due to unspecified occlusion or stenosis of left anterior cerebral artery: Secondary | ICD-10-CM | POA: Diagnosis not present

## 2023-06-27 DIAGNOSIS — Z7951 Long term (current) use of inhaled steroids: Secondary | ICD-10-CM

## 2023-06-27 DIAGNOSIS — G20A1 Parkinson's disease without dyskinesia, without mention of fluctuations: Secondary | ICD-10-CM | POA: Diagnosis present

## 2023-06-27 DIAGNOSIS — I639 Cerebral infarction, unspecified: Secondary | ICD-10-CM | POA: Diagnosis present

## 2023-06-27 DIAGNOSIS — E785 Hyperlipidemia, unspecified: Secondary | ICD-10-CM | POA: Diagnosis present

## 2023-06-27 DIAGNOSIS — Z79899 Other long term (current) drug therapy: Secondary | ICD-10-CM

## 2023-06-27 DIAGNOSIS — J9589 Other postprocedural complications and disorders of respiratory system, not elsewhere classified: Secondary | ICD-10-CM | POA: Diagnosis not present

## 2023-06-27 DIAGNOSIS — J440 Chronic obstructive pulmonary disease with acute lower respiratory infection: Secondary | ICD-10-CM | POA: Diagnosis not present

## 2023-06-27 DIAGNOSIS — I63512 Cerebral infarction due to unspecified occlusion or stenosis of left middle cerebral artery: Secondary | ICD-10-CM | POA: Diagnosis not present

## 2023-06-27 DIAGNOSIS — R1310 Dysphagia, unspecified: Secondary | ICD-10-CM | POA: Diagnosis present

## 2023-06-27 DIAGNOSIS — Z7902 Long term (current) use of antithrombotics/antiplatelets: Secondary | ICD-10-CM

## 2023-06-27 DIAGNOSIS — Z794 Long term (current) use of insulin: Secondary | ICD-10-CM | POA: Diagnosis not present

## 2023-06-27 DIAGNOSIS — N186 End stage renal disease: Secondary | ICD-10-CM | POA: Diagnosis not present

## 2023-06-27 DIAGNOSIS — I5022 Chronic systolic (congestive) heart failure: Secondary | ICD-10-CM | POA: Diagnosis present

## 2023-06-27 DIAGNOSIS — R0689 Other abnormalities of breathing: Secondary | ICD-10-CM | POA: Diagnosis not present

## 2023-06-27 HISTORY — PX: IR INTRAVSC STENT CERV CAROTID W/O EMB-PROT MOD SED INC ANGIO: IMG2304

## 2023-06-27 HISTORY — PX: IR US GUIDE VASC ACCESS RIGHT: IMG2390

## 2023-06-27 HISTORY — PX: IR PERCUTANEOUS ART THROMBECTOMY/INFUSION INTRACRANIAL INC DIAG ANGIO: IMG6087

## 2023-06-27 HISTORY — PX: IR CT HEAD LTD: IMG2386

## 2023-06-27 HISTORY — PX: RADIOLOGY WITH ANESTHESIA: SHX6223

## 2023-06-27 LAB — DIFFERENTIAL
Abs Immature Granulocytes: 0.15 10*3/uL — ABNORMAL HIGH (ref 0.00–0.07)
Basophils Absolute: 0.1 10*3/uL (ref 0.0–0.1)
Basophils Relative: 1 %
Eosinophils Absolute: 0.2 10*3/uL (ref 0.0–0.5)
Eosinophils Relative: 1 %
Immature Granulocytes: 1 %
Lymphocytes Relative: 6 %
Lymphs Abs: 0.7 10*3/uL (ref 0.7–4.0)
Monocytes Absolute: 0.8 10*3/uL (ref 0.1–1.0)
Monocytes Relative: 7 %
Neutro Abs: 9.8 10*3/uL — ABNORMAL HIGH (ref 1.7–7.7)
Neutrophils Relative %: 84 %

## 2023-06-27 LAB — POCT I-STAT 7, (LYTES, BLD GAS, ICA,H+H)
Acid-Base Excess: 0 mmol/L (ref 0.0–2.0)
Bicarbonate: 25.7 mmol/L (ref 20.0–28.0)
Calcium, Ion: 1.19 mmol/L (ref 1.15–1.40)
HCT: 37 % — ABNORMAL LOW (ref 39.0–52.0)
Hemoglobin: 12.6 g/dL — ABNORMAL LOW (ref 13.0–17.0)
O2 Saturation: 100 %
Potassium: 4.2 mmol/L (ref 3.5–5.1)
Sodium: 138 mmol/L (ref 135–145)
TCO2: 27 mmol/L (ref 22–32)
pCO2 arterial: 47.2 mmHg (ref 32–48)
pH, Arterial: 7.344 — ABNORMAL LOW (ref 7.35–7.45)
pO2, Arterial: 238 mmHg — ABNORMAL HIGH (ref 83–108)

## 2023-06-27 LAB — I-STAT CHEM 8, ED
BUN: 26 mg/dL — ABNORMAL HIGH (ref 8–23)
Calcium, Ion: 1.11 mmol/L — ABNORMAL LOW (ref 1.15–1.40)
Chloride: 104 mmol/L (ref 98–111)
Creatinine, Ser: 1.4 mg/dL — ABNORMAL HIGH (ref 0.61–1.24)
Glucose, Bld: 137 mg/dL — ABNORMAL HIGH (ref 70–99)
HCT: 42 % (ref 39.0–52.0)
Hemoglobin: 14.3 g/dL (ref 13.0–17.0)
Potassium: 4.5 mmol/L (ref 3.5–5.1)
Sodium: 141 mmol/L (ref 135–145)
TCO2: 26 mmol/L (ref 22–32)

## 2023-06-27 LAB — COMPREHENSIVE METABOLIC PANEL WITH GFR
ALT: 15 U/L (ref 0–44)
AST: 19 U/L (ref 15–41)
Albumin: 3.4 g/dL — ABNORMAL LOW (ref 3.5–5.0)
Alkaline Phosphatase: 61 U/L (ref 38–126)
Anion gap: 9 (ref 5–15)
BUN: 20 mg/dL (ref 8–23)
CO2: 24 mmol/L (ref 22–32)
Calcium: 8.7 mg/dL — ABNORMAL LOW (ref 8.9–10.3)
Chloride: 105 mmol/L (ref 98–111)
Creatinine, Ser: 1.4 mg/dL — ABNORMAL HIGH (ref 0.61–1.24)
GFR, Estimated: 52 mL/min — ABNORMAL LOW (ref >60)
Glucose, Bld: 139 mg/dL — ABNORMAL HIGH (ref 70–99)
Potassium: 4.5 mmol/L (ref 3.5–5.1)
Sodium: 138 mmol/L (ref 135–145)
Total Bilirubin: 0.5 mg/dL (ref 0.0–1.2)
Total Protein: 6.4 g/dL — ABNORMAL LOW (ref 6.5–8.1)

## 2023-06-27 LAB — APTT: aPTT: <22 s — ABNORMAL LOW (ref 24–36)

## 2023-06-27 LAB — PROTIME-INR
INR: 1 (ref 0.8–1.2)
Prothrombin Time: 13.1 s (ref 11.4–15.2)

## 2023-06-27 LAB — CBC
HCT: 42.2 % (ref 39.0–52.0)
Hemoglobin: 14.1 g/dL (ref 13.0–17.0)
MCH: 32.2 pg (ref 26.0–34.0)
MCHC: 33.4 g/dL (ref 30.0–36.0)
MCV: 96.3 fL (ref 80.0–100.0)
Platelets: 179 10*3/uL (ref 150–400)
RBC: 4.38 MIL/uL (ref 4.22–5.81)
RDW: 11.9 % (ref 11.5–15.5)
WBC: 11.7 10*3/uL — ABNORMAL HIGH (ref 4.0–10.5)
nRBC: 0 % (ref 0.0–0.2)

## 2023-06-27 LAB — ETHANOL: Alcohol, Ethyl (B): <15 mg/dL (ref <15)

## 2023-06-27 SURGERY — RADIOLOGY WITH ANESTHESIA
Anesthesia: General

## 2023-06-27 MED ORDER — NITROGLYCERIN 1 MG/10 ML FOR IR/CATH LAB
INTRA_ARTERIAL | Status: AC
  Filled 2023-06-27: qty 10

## 2023-06-27 MED ORDER — HEPARIN SODIUM (PORCINE) 1000 UNIT/ML IJ SOLN
INTRAMUSCULAR | Status: DC | PRN
Start: 2023-06-27 — End: 2023-06-27
  Administered 2023-06-27: 3000 [IU] via INTRAVENOUS
  Administered 2023-06-27: 1000 [IU] via INTRAVENOUS

## 2023-06-27 MED ORDER — PHENYLEPHRINE HCL-NACL 20-0.9 MG/250ML-% IV SOLN
INTRAVENOUS | Status: DC | PRN
  Administered 2023-06-27: 15 ug/min via INTRAVENOUS

## 2023-06-27 MED ORDER — REVEFENACIN 175 MCG/3ML IN SOLN
175.0000 ug | Freq: Every day | RESPIRATORY_TRACT | Status: DC
  Administered 2023-06-28 – 2023-07-08 (×11): 175 ug via RESPIRATORY_TRACT
  Filled 2023-06-27 (×11): qty 3

## 2023-06-27 MED ORDER — CHLORHEXIDINE GLUCONATE CLOTH 2 % EX PADS
6.0000 | MEDICATED_PAD | Freq: Every day | CUTANEOUS | Status: DC
  Administered 2023-06-28 – 2023-07-07 (×11): 6 via TOPICAL

## 2023-06-27 MED ORDER — FENTANYL CITRATE PF 50 MCG/ML IJ SOSY: 25.0000 ug | PREFILLED_SYRINGE | INTRAMUSCULAR | Status: DC | PRN

## 2023-06-27 MED ORDER — ASPIRIN 325 MG PO TABS
ORAL_TABLET | ORAL | Status: AC
  Filled 2023-06-27: qty 1

## 2023-06-27 MED ORDER — IOHEXOL 300 MG/ML  SOLN
150.0000 mL | Freq: Once | INTRAMUSCULAR | Status: AC | PRN
  Administered 2023-06-27: 60 mL via INTRA_ARTERIAL

## 2023-06-27 MED ORDER — CARBIDOPA-LEVODOPA 10-100 MG PO TABS
1.5000 | ORAL_TABLET | Freq: Three times a day (TID) | ORAL | Status: DC
  Administered 2023-06-28 – 2023-07-08 (×31): 1.5
  Filled 2023-06-27 (×34): qty 1.5

## 2023-06-27 MED ORDER — LACTATED RINGERS IV SOLN: INTRAVENOUS | Status: DC | PRN

## 2023-06-27 MED ORDER — FENTANYL CITRATE (PF) 100 MCG/2ML IJ SOLN
INTRAMUSCULAR | Status: AC
  Filled 2023-06-27: qty 2

## 2023-06-27 MED ORDER — ROCURONIUM BROMIDE 10 MG/ML (PF) SYRINGE
PREFILLED_SYRINGE | INTRAVENOUS | Status: DC | PRN
  Administered 2023-06-27: 50 mg via INTRAVENOUS
  Administered 2023-06-27: 20 mg via INTRAVENOUS

## 2023-06-27 MED ORDER — ORAL CARE MOUTH RINSE: 15.0000 mL | OROMUCOSAL | Status: DC | PRN

## 2023-06-27 MED ORDER — PHENYLEPHRINE 80 MCG/ML (10ML) SYRINGE FOR IV PUSH (FOR BLOOD PRESSURE SUPPORT)
PREFILLED_SYRINGE | INTRAVENOUS | Status: DC | PRN
Start: 2023-06-27 — End: 2023-06-27
  Administered 2023-06-27: 80 ug via INTRAVENOUS

## 2023-06-27 MED ORDER — BUDESONIDE 0.5 MG/2ML IN SUSP
0.5000 mg | Freq: Two times a day (BID) | RESPIRATORY_TRACT | Status: DC
  Administered 2023-06-27 – 2023-07-08 (×21): 0.5 mg via RESPIRATORY_TRACT
  Filled 2023-06-27 (×21): qty 2

## 2023-06-27 MED ORDER — FENTANYL CITRATE (PF) 250 MCG/5ML IJ SOLN
INTRAMUSCULAR | Status: DC | PRN
  Administered 2023-06-27: 100 ug via INTRAVENOUS

## 2023-06-27 MED ORDER — ACETAMINOPHEN 650 MG RE SUPP: 650.0000 mg | RECTAL | Status: DC | PRN

## 2023-06-27 MED ORDER — INSULIN ASPART 100 UNIT/ML IJ SOLN
0.0000 [IU] | INTRAMUSCULAR | Status: DC
  Administered 2023-06-28 (×3): 2 [IU] via SUBCUTANEOUS
  Administered 2023-06-28 – 2023-06-29 (×4): 3 [IU] via SUBCUTANEOUS
  Administered 2023-06-29: 2 [IU] via SUBCUTANEOUS
  Administered 2023-06-29 – 2023-06-30 (×4): 3 [IU] via SUBCUTANEOUS
  Administered 2023-06-30: 2 [IU] via SUBCUTANEOUS
  Administered 2023-06-30 (×2): 3 [IU] via SUBCUTANEOUS
  Administered 2023-06-30: 2 [IU] via SUBCUTANEOUS
  Administered 2023-07-01 (×3): 3 [IU] via SUBCUTANEOUS
  Administered 2023-07-01 (×2): 5 [IU] via SUBCUTANEOUS
  Administered 2023-07-01: 3 [IU] via SUBCUTANEOUS
  Administered 2023-07-02: 5 [IU] via SUBCUTANEOUS
  Administered 2023-07-02 (×2): 3 [IU] via SUBCUTANEOUS
  Administered 2023-07-02: 5 [IU] via SUBCUTANEOUS
  Administered 2023-07-02: 3 [IU] via SUBCUTANEOUS
  Administered 2023-07-02: 5 [IU] via SUBCUTANEOUS
  Administered 2023-07-03: 3 [IU] via SUBCUTANEOUS
  Administered 2023-07-03: 8 [IU] via SUBCUTANEOUS
  Administered 2023-07-03: 2 [IU] via SUBCUTANEOUS
  Administered 2023-07-03 (×2): 3 [IU] via SUBCUTANEOUS
  Administered 2023-07-03: 2 [IU] via SUBCUTANEOUS
  Administered 2023-07-04 (×2): 3 [IU] via SUBCUTANEOUS
  Administered 2023-07-04: 5 [IU] via SUBCUTANEOUS
  Administered 2023-07-04: 3 [IU] via SUBCUTANEOUS

## 2023-06-27 MED ORDER — FENTANYL CITRATE PF 50 MCG/ML IJ SOSY
25.0000 ug | PREFILLED_SYRINGE | INTRAMUSCULAR | Status: DC | PRN
  Administered 2023-06-27: 50 ug via INTRAVENOUS
  Filled 2023-06-27: qty 1

## 2023-06-27 MED ORDER — ASPIRIN 325 MG PO TABS
ORAL_TABLET | ORAL | Status: AC | PRN
  Administered 2023-06-27: 325 mg

## 2023-06-27 MED ORDER — STROKE: EARLY STAGES OF RECOVERY BOOK: Freq: Once | Status: DC

## 2023-06-27 MED ORDER — TICAGRELOR 90 MG PO TABS
ORAL_TABLET | ORAL | Status: AC
  Filled 2023-06-27: qty 2

## 2023-06-27 MED ORDER — ORAL CARE MOUTH RINSE
15.0000 mL | OROMUCOSAL | Status: DC
Start: 2023-06-28 — End: 2023-07-02
  Administered 2023-06-28 – 2023-07-02 (×47): 15 mL via OROMUCOSAL

## 2023-06-27 MED ORDER — CLEVIDIPINE BUTYRATE 0.5 MG/ML IV EMUL
INTRAVENOUS | Status: AC
  Filled 2023-06-27: qty 100

## 2023-06-27 MED ORDER — SODIUM CHLORIDE 0.9 % IV BOLUS: 250.0000 mL | INTRAVENOUS | Status: AC | PRN

## 2023-06-27 MED ORDER — ROPINIROLE HCL 0.25 MG PO TABS
0.2500 mg | ORAL_TABLET | Freq: Every day | ORAL | Status: DC
  Filled 2023-06-27: qty 1

## 2023-06-27 MED ORDER — TENECTEPLASE FOR STROKE
0.2500 mg/kg | PACK | Freq: Once | INTRAVENOUS | Status: AC
Start: 2023-06-27 — End: 2023-06-27
  Administered 2023-06-27: 21 mg via INTRAVENOUS
  Filled 2023-06-27: qty 10

## 2023-06-27 MED ORDER — IOHEXOL 350 MG/ML SOLN
75.0000 mL | Freq: Once | INTRAVENOUS | Status: AC | PRN
  Administered 2023-06-27: 75 mL via INTRAVENOUS

## 2023-06-27 MED ORDER — ONDANSETRON HCL 4 MG/2ML IJ SOLN
INTRAMUSCULAR | Status: DC | PRN
  Administered 2023-06-27: 4 mg via INTRAVENOUS

## 2023-06-27 MED ORDER — PANTOPRAZOLE SODIUM 40 MG IV SOLR
40.0000 mg | Freq: Every day | INTRAVENOUS | Status: DC
  Administered 2023-06-28 – 2023-07-07 (×11): 40 mg via INTRAVENOUS
  Filled 2023-06-27 (×11): qty 10

## 2023-06-27 MED ORDER — SODIUM CHLORIDE 0.9 % IV SOLN: INTRAVENOUS | Status: DC

## 2023-06-27 MED ORDER — TICAGRELOR 60 MG PO TABS
ORAL_TABLET | ORAL | Status: AC | PRN
  Administered 2023-06-27: 180 mg

## 2023-06-27 MED ORDER — ACETAMINOPHEN 325 MG PO TABS
650.0000 mg | ORAL_TABLET | ORAL | Status: DC | PRN
  Administered 2023-07-01 – 2023-07-05 (×6): 650 mg via ORAL
  Filled 2023-06-27 (×6): qty 2

## 2023-06-27 MED ORDER — PROPOFOL 10 MG/ML IV BOLUS
INTRAVENOUS | Status: DC | PRN
  Administered 2023-06-27: 150 mg via INTRAVENOUS
  Administered 2023-06-27: 50 mg via INTRAVENOUS

## 2023-06-27 MED ORDER — ACETAMINOPHEN 160 MG/5ML PO SOLN
650.0000 mg | ORAL | Status: DC | PRN
  Administered 2023-07-03 – 2023-07-07 (×10): 650 mg
  Filled 2023-06-27 (×10): qty 20.3

## 2023-06-27 MED ORDER — CARBIDOPA-LEVODOPA 10-100 MG PO TABS
1.5000 | ORAL_TABLET | Freq: Three times a day (TID) | ORAL | Status: DC
  Filled 2023-06-27: qty 1.5

## 2023-06-27 MED ORDER — DOCUSATE SODIUM 50 MG/5ML PO LIQD
100.0000 mg | Freq: Two times a day (BID) | ORAL | Status: DC
  Administered 2023-06-28 – 2023-06-29 (×4): 100 mg
  Filled 2023-06-27 (×4): qty 10

## 2023-06-27 MED ORDER — POLYETHYLENE GLYCOL 3350 17 G PO PACK
17.0000 g | PACK | Freq: Every day | ORAL | Status: DC
  Administered 2023-06-28 – 2023-07-06 (×9): 17 g
  Filled 2023-06-27 (×9): qty 1

## 2023-06-27 MED ORDER — PROPOFOL 1000 MG/100ML IV EMUL
0.0000 ug/kg/min | INTRAVENOUS | Status: DC
  Administered 2023-06-28: 50 ug/kg/min via INTRAVENOUS
  Administered 2023-06-28 (×3): 40 ug/kg/min via INTRAVENOUS
  Filled 2023-06-27 (×3): qty 100

## 2023-06-27 MED ORDER — HEPARIN (PORCINE) 25000 UT/250ML-% IV SOLN
500.0000 [IU]/h | INTRAVENOUS | Status: AC
  Administered 2023-06-27: 500 [IU]/h via INTRAVENOUS
  Filled 2023-06-27: qty 250

## 2023-06-27 MED ORDER — CLEVIDIPINE BUTYRATE 0.5 MG/ML IV EMUL
0.0000 mg/h | INTRAVENOUS | Status: DC
Start: 2023-06-27 — End: 2023-06-29
  Administered 2023-06-27: 2 mg/h via INTRAVENOUS

## 2023-06-27 MED ORDER — ALBUTEROL SULFATE (2.5 MG/3ML) 0.083% IN NEBU
2.5000 mg | INHALATION_SOLUTION | RESPIRATORY_TRACT | Status: DC | PRN
  Administered 2023-06-28 – 2023-07-02 (×5): 2.5 mg via RESPIRATORY_TRACT
  Filled 2023-06-27 (×6): qty 3

## 2023-06-27 MED ORDER — PROPOFOL 500 MG/50ML IV EMUL
INTRAVENOUS | Status: DC | PRN
  Administered 2023-06-27: 50 ug/kg/min via INTRAVENOUS

## 2023-06-27 MED ORDER — SUCCINYLCHOLINE CHLORIDE 200 MG/10ML IV SOSY
PREFILLED_SYRINGE | INTRAVENOUS | Status: DC | PRN
  Administered 2023-06-27: 200 mg via INTRAVENOUS

## 2023-06-27 MED ORDER — ARFORMOTEROL TARTRATE 15 MCG/2ML IN NEBU
15.0000 ug | INHALATION_SOLUTION | Freq: Two times a day (BID) | RESPIRATORY_TRACT | Status: DC
  Administered 2023-06-27 – 2023-07-08 (×21): 15 ug via RESPIRATORY_TRACT
  Filled 2023-06-27 (×21): qty 2

## 2023-06-27 NOTE — Progress Notes (Signed)
 Cleveprex off

## 2023-06-27 NOTE — Procedures (Signed)
 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 Left M2 occlusion with successful suction thrombectomy TICI Score: 3  Length of Bedrest: 2 hours from NIR standpoint BP goal next 24 hours: SBP 120-160 Procedure medication: Heparin 4000 units IV  Sheath Closure: Angioseal Disposition: To ICU  Additional note: Patient vomited prior to procedure and received oropharyngeal suctioning.  Please call with questions, concerns, or change in patient condition  Reagan Camera MD  Attending addendum:  Due to ICA stent placement, the patient was dosed with 325 mg ASA and 180 brilinta after head CT showed no bleed.  A heparin infusion of 500 units per hour (no titration) was also started for planned duration of 6 hours.

## 2023-06-27 NOTE — Progress Notes (Signed)
Decreased Cleviprex

## 2023-06-27 NOTE — H&P (Signed)
 Neurology H&P  CC: Aphasia  History is obtained from:Patient  HPI: Rickey Baker is a 78 y.o. male with a history of hypertension, hyperlipidemia, Parkinson's disease who presents with right-sided weakness and aphasia.  He was in his normal state of health when his wife laid down for a nap around 5 PM.  She subsequently got up and then noted that he was slumped to the side and unable to talk.  She therefore called 911 and he was activated as a code stroke.  He is brought in the emergency department emergently evaluated and was recognized that he was likely a large vessel occlusion based on his clinical presentation.  CT revealed no hemorrhage, and while I was calling to get consent for IV TNK, the patient was already hooked up and ready to go for a CTA and so to avoid delays while I discussed things with his family, the CTA was obtained which confirmed a ICA/MCA occlusion.  The patient does have Parkinson's disease, and at baseline he has had some impairments to his ability to move around, and does use a scooter at times but is able to still walk independently.  He is able to take care of his own activities of daily living.  LKW: 5 PM tpa given?: yes IR Thrombectomy? yes Modified Rankin Scale: 3-Moderate disability-requires help but walks WITHOUT assistance NIHSS: 24  Past Medical History:  Diagnosis Date   Arthritis    COPD (chronic obstructive pulmonary disease) (HCC)    Hyperlipidemia    Hypertension    Parkinson's disease    PTSD (post-traumatic stress disorder)      Family History  Problem Relation Age of Onset   Asthma Son      Social History:  reports that he has been smoking cigarettes. He has a 50 pack-year smoking history. He has never used smokeless tobacco. He reports that he does not currently use alcohol. He reports that he does not currently use drugs.   Prior to Admission medications   Medication Sig Start Date End Date Taking? Authorizing Provider  albuterol   (PROVENTIL ) (2.5 MG/3ML) 0.083% nebulizer solution Take 3 mLs (2.5 mg total) by nebulization every 6 (six) hours as needed for wheezing or shortness of breath. 07/21/21   Aleck Hurdle, MD  albuterol  (VENTOLIN  HFA) 108 [phone] Base) MCG/ACT inhaler INHALE 2 PUFFS BY MOUTH FOUR TIMES A DAY AS NEEDED FOR WHEEZING, SHORTNESS OF BREATHE, AND COUGH 11/23/20   [provider]  Budeson-Glycopyrrol-Formoterol (BREZTRI  AEROSPHERE) 160-9-4.8 MCG/ACT AERO Inhale 2 puffs into the lungs in the morning and at bedtime. 10/07/22   Cobb, Mariah Shines, NP  carbidopa-levodopa (SINEMET IR) 10-100 MG tablet Take 1 tablet by mouth 3 (three) times daily.    [provider]  celecoxib (CELEBREX) 200 MG capsule Take 1 tablet by mouth daily. 01/12/10   [provider]  ezetimibe (ZETIA) 10 MG tablet TAKE ONE-HALF TABLET BY MOUTH DAILY FOR CHOLESTEROL CONTROL 11/23/20   [provider]  gabapentin (NEURONTIN) 600 MG tablet Take by mouth.    [provider]  hydrOXYzine (ATARAX) 10 MG tablet Take by mouth.    [provider]  metoprolol succinate (TOPROL-XL) 50 MG 24 hr tablet Take by mouth.    [provider]  omeprazole (PRILOSEC) 20 MG capsule Take by mouth.    [provider]  predniSONE  (DELTASONE ) 10 MG tablet 4 tabs for 2 days, then 3 tabs for 2 days, 2 tabs for 2 days, then 1 tab for 2 days, then  stop 10/07/22   Cobb, Mariah Shines, NP  pyridoxine (B-6) 100 MG tablet Take by mouth.    [provider]  ranitidine (ZANTAC) 300 MG capsule Take by mouth.    [provider]  rOPINIRole (REQUIP) 0.25 MG tablet Take by mouth.    [provider]  topiramate (TOPAMAX) 100 MG tablet Take by mouth.    [provider]     Exam: Current vital signs: Wt 82.8 kg   BMI 26.57 kg/m    Physical Exam  Constitutional: Appears well-developed and well-nourished.  Psych: Affect appropriate to situation Eyes: No scleral injection HENT:  No OP obstruction Head: Normocephalic.  Cardiovascular: Normal rate and regular rhythm.  Respiratory: Effort normal and non-labored GI: Soft.  No distension. There is no tenderness.  Skin: WDI, some dry scabbing on scalp  Neuro: Mental Status: Patient is awake, alert, very densely aphasic Cranial Nerves: II: He blinks to threat from the left but not right.  III,IV, VI: He is unable to cross midline to the right, but is able to move to the midline VII: He has right facial weakness Motor: He has a flaccid right hemiparesis, he does withdraw his right leg minimally, similar in right arm.  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   Secondary Diagnosis: Essential (primary) hypertension Parkinson's disease  Impression: 78 year old man with acute left ICA/MCA occlusion and corresponding signs and symptoms.  I discussed risks, benefits, and alternatives of IV tenecteplase with the patient's family who agreed with proceeding.  After discussion with neuro IR and the patient's family, consent was also obtained to proceed with thrombectomy.  Plan: - HgbA1c, fasting lipid panel - MRI of the brain without contrast - Frequent neuro checks - Echocardiogram - Prophylactic therapy-none for 24 hours - Risk factor modification - Telemetry monitoring - PT consult, OT consult, Speech consult - Stroke team to follow  - Continue home Sinemet/ropinirole   This patient is critically ill and at significant risk of neurological worsening, death and care requires constant  monitoring of vital signs, hemodynamics,respiratory and cardiac monitoring, neurological assessment, discussion with family, other specialists and medical decision making of high complexity. I spent 55 minutes of neurocritical care time  in the care of  this patient. This was time spent independent of any time provided by nurse practitioner or PA.  Ann Keto, MD [facility]   If 7pm- 7am, please page neurology on call as listed in AMION.

## 2023-06-27 NOTE — Progress Notes (Signed)
 Decreased cleviprex gtt

## 2023-06-27 NOTE — ED Provider Notes (Signed)
 [place] EMERGENCY DEPARTMENT AT [facility] Provider Note   CSN: [id number] Arrival date & time: 06/27/23  1953     History  No chief complaint on file.   Rickey Baker is a 78 y.o. male.  Patient is a 78 year old male with a past medical history of hypertension, diabetes, GERD, Parkinson's presenting to the emergency department as a code stroke.  Per EMS, family last seen him well at 4 PM.  His family member then went to take a nap and woke up to hearing a thud and he had fallen off the couch.  They state that he was awake but not talking to them and appeared to be weak more on the right side compared to the left and EMS was called.  EMS reports that the patient was initially staring off when they got there and has now become more responsive but is aphasic with a left-sided gaze preference, right greater than left sided weakness and a right sided facial droop.  He is not on any blood thinners per the family.  Blood sugar was within normal range and blood pressure in the 150s for EMS.  The history is provided by the EMS personnel. The history is limited by the condition of the patient.       Home Medications Prior to Admission medications   Medication Sig Start Date End Date Taking? Authorizing Provider  albuterol  (PROVENTIL ) (2.5 MG/3ML) 0.083% nebulizer solution Take 3 mLs (2.5 mg total) by nebulization every 6 (six) hours as needed for wheezing or shortness of breath. 07/21/21   Desai, Nikita S, MD  albuterol  (VENTOLIN  HFA) 108 (90 Base) MCG/ACT inhaler INHALE 2 PUFFS BY MOUTH FOUR TIMES A DAY AS NEEDED FOR WHEEZING, SHORTNESS OF BREATHE, AND COUGH 11/23/20   [provider]  Budeson-Glycopyrrol-Formoterol (BREZTRI  AEROSPHERE) 160-9-4.8 MCG/ACT AERO Inhale 2 puffs into the lungs in the morning and at bedtime. 10/07/22   Cobb, Mariah Shines, NP  carbidopa-levodopa (SINEMET IR) 10-100 MG tablet Take 1 tablet by mouth 3 (three) times daily.    [provider]   celecoxib (CELEBREX) 200 MG capsule Take 1 tablet by mouth daily. 01/12/10   [provider]  ezetimibe (ZETIA) 10 MG tablet TAKE ONE-HALF TABLET BY MOUTH DAILY FOR CHOLESTEROL CONTROL 11/23/20   [provider]  gabapentin (NEURONTIN) 600 MG tablet Take by mouth.    [provider]  hydrOXYzine (ATARAX) 10 MG tablet Take by mouth.    [provider]  metoprolol succinate (TOPROL-XL) 50 MG 24 hr tablet Take by mouth.    [provider]  omeprazole (PRILOSEC) 20 MG capsule Take by mouth.    [provider]  predniSONE  (DELTASONE ) 10 MG tablet 4 tabs for 2 days, then 3 tabs for 2 days, 2 tabs for 2 days, then 1 tab for 2 days, then stop 10/07/22   Cobb, Katherine V, NP  pyridoxine (B-6) 100 MG tablet Take by mouth.    [provider]  ranitidine (ZANTAC) 300 MG capsule Take by mouth.    [provider]  rOPINIRole (REQUIP) 0.25 MG tablet Take by mouth.    [provider]  topiramate (TOPAMAX) 100 MG tablet Take by mouth.    [provider]      Allergies    Indomethacin    Review of Systems   Review of Systems  Physical Exam Updated Vital Signs Wt 82.8 kg   BMI 26.57 kg/m  Physical Exam Vitals and nursing note reviewed.  Constitutional:  General: He is not in acute distress.    Appearance: Normal appearance.  HENT:     Head: Normocephalic and atraumatic.     Nose: Nose normal.     Mouth/Throat:     Mouth: Mucous membranes are moist.  Eyes:     Comments: L-sided gaze preference, does not cross midline  Pulmonary:     Effort: Pulmonary effort is normal.  Abdominal:     General: Abdomen is flat.  Musculoskeletal:        General: Normal range of motion.     Cervical back: Normal range of motion.  Skin:    General: Skin is warm and dry.  Neurological:     Mental Status: He is alert.     Comments: Garbled speech R-sided facial droop Not following commands in all 4 extremities Does  withdrawal RLE to pain but unable to lift off bed     ED Results / Procedures / Treatments   Labs (all labs ordered are listed, but only abnormal results are displayed) Labs Reviewed  APTT - Abnormal; Notable for the following components:      Result Value   aPTT <22 (*)    All other components within normal limits  CBC - Abnormal; Notable for the following components:   WBC 11.7 (*)    All other components within normal limits  DIFFERENTIAL - Abnormal; Notable for the following components:   Neutro Abs 9.8 (*)    Abs Immature Granulocytes 0.15 (*)    All other components within normal limits  I-STAT CHEM 8, ED - Abnormal; Notable for the following components:   BUN 26 (*)    Creatinine, Ser 1.40 (*)    Glucose, Bld 137 (*)    Calcium, Ion 1.11 (*)    All other components within normal limits  PROTIME-INR  ETHANOL  COMPREHENSIVE METABOLIC PANEL WITH GFR  RAPID URINE DRUG SCREEN, HOSP PERFORMED  LIPID PANEL  HEMOGLOBIN A1C    EKG None  Radiology CT HEAD CODE STROKE WO CONTRAST Result Date: 06/27/2023 CLINICAL DATA:  Code stroke.  Neuro deficit, acute, stroke suspected EXAM: CT HEAD WITHOUT CONTRAST TECHNIQUE: Contiguous axial images were obtained from the base of the skull through the vertex without intravenous contrast. RADIATION DOSE REDUCTION: This exam was performed according to the departmental dose-optimization program which includes automated exposure control, adjustment of the mA and/or kV according to patient size and/or use of iterative reconstruction technique. COMPARISON:  None Available. FINDINGS: Brain: No evidence of acute large vascular territory infarction, hemorrhage, hydrocephalus, extra-axial collection or mass lesion/mass effect. Patchy white matter hypodensities, compatible with chronic microvascular ischemic disease. Vascular: No hyperdense vessel or unexpected calcification. Skull: No acute fracture. Sinuses/Orbits: No acute orbital findings.  Clear  sinuses. ASPECTS [facility] Stroke Program Early CT Score) Total score (0-10 with 10 being normal): 10. IMPRESSION: 1. Hyperdense left M1/M2 MCA concerning for thrombus. See forthcoming CTA. 2. No evidence of acute large vascular territory infarct or acute hemorrhage. ASPECTS 10. Code stroke imaging results were communicated on 06/27/2023 at 8:19 pm to provider Dr. Alecia Ames via telephone, who verbally acknowledged these results. Electronically Signed   By: Stevenson Elbe M.D.   On: 06/27/2023 20:21    Procedures .Critical Care  Performed by: Kingsley, Una Yeomans K, DO Authorized by: Nolberto Batty, DO   Critical care provider statement:    Critical care time (minutes):  30   Critical care was necessary to treat or prevent imminent or life-threatening deterioration of the following conditions:  CNS failure or compromise   Critical care was time spent personally by me on the following activities:  Development of treatment plan with patient or surrogate, discussions with consultants, evaluation of patient's response to treatment, examination of patient, ordering and review of laboratory studies, ordering and review of radiographic studies, ordering and performing treatments and interventions, pulse oximetry, re-evaluation of patient's condition and review of old charts     Medications Ordered in ED Medications  tenecteplase (TNKASE) injection for Stroke 21 mg (has no administration in time range)   stroke: early stages of recovery book (has no administration in time range)  0.9 %  sodium chloride infusion (has no administration in time range)  acetaminophen  (TYLENOL ) tablet 650 mg (has no administration in time range)    Or  acetaminophen  (TYLENOL ) 160 MG/5ML solution 650 mg (has no administration in time range)    Or  acetaminophen  (TYLENOL ) suppository 650 mg (has no administration in time range)  pantoprazole (PROTONIX) injection 40 mg (has no administration in time range)  iohexol   (OMNIPAQUE ) 350 MG/ML injection 75 mL (75 mLs Intravenous Contrast Given 06/27/23 2009)    ED Course/ Medical Decision Making/ A&P Clinical Course as of 06/27/23 2027  Tue Jun 27, 2023  2020 Patient deemed to be TNK and thrombectomy candidate. [VK]    Clinical Course User Index [VK] Kingsley, Tahmid Stonehocker K, DO                                 Medical Decision Making This patient presents to the ED with chief complaint(s) of code stroke with pertinent past medical history of HTN, DM, Parkinson's GERD which further complicates the presenting complaint. The complaint involves an extensive differential diagnosis and also carries with it a high risk of complications and morbidity.    The differential diagnosis includes CVA, TIA, ICH, mass effect, hypo or hyperglycemia, infection, electrolyte abnormality  Additional history obtained: Additional history obtained from EMS  Records reviewed Care Everywhere/External Records  ED Course and Reassessment: Patient was made a prehospital arrival code stroke and was immediately evaluated by neurology and myself on his arrival.  The patient's airway was intact.  He was noted to have garbled speech, right sided facial droop, left gaze preference and right greater than left sided weakness, not following commands.  Patient had IV access obtained and was immediately transported to CT scanner.  He is within the window for TNK.  Independent labs interpretation:  The following labs were independently interpreted: mildly elevated Cr, unknown baseline, mild leukocytosis  Independent visualization of imaging: - I independently visualized the following imaging with scope of interpretation limited to determining acute life threatening conditions related to emergency care: CTH/CTA, which revealed L M1 occlusion  Consultation: - Consulted or discussed management/test interpretation w/ external professional: neurology  Consideration for admission or further workup:  patient requires admission for acute stroke Social Determinants of health: N/A    Amount and/or Complexity of Data Reviewed Labs: ordered. Radiology: ordered.  Risk Decision regarding hospitalization.          Final Clinical Impression(s) / ED Diagnoses Final diagnoses:  Cerebrovascular accident (CVA), unspecified mechanism [facility])    Rx / DC Orders ED Discharge Orders     None         Kingsley, Detric Scalisi K, DO 06/27/23 2027

## 2023-06-27 NOTE — Consult Note (Cosign Needed Addendum)
 NAME:  Rickey Baker, MRN:  [id number], DOB:  March 09, 1945, LOS: 0 ADMISSION DATE:  06/27/2023, CONSULTATION DATE:  06/27/23 REFERRING MD:  Alecia Ames CHIEF COMPLAINT:  Vent Management   History of Present Illness:  Pt is encephelopathic; therefore, this HPI is obtained from chart review. Rickey Baker is a 78 y.o. male who has a PMH as below clued but not limited to hypertension, hyperlipidemia, Parkinson disease.  He presented to [facility] ED on 06/27/2023 with right-sided weakness and aphasia.  He was in his normal state of health earlier in the evening when his wife helped him lie down for a nap around 5 PM.  When she woke up later, she found that he was slumped to the side and unable to talk.  She called 911 and he was brought to the ED as a code stroke.  CT revealed no hemorrhage, CTA confirmed a left ICA/MCA occlusion.  He did receive TNKase at 2004.  He was then taken to neuro IR where he had a left M2 thrombectomy, L ICA stent placement with angioplasty performed.  Just prior to the procedure he apparently had a vomiting episode requiring suctioning therefore, following the procedure, he remained intubated and was transferred to the ICU PCCM was subsequently called in consultation for vent management. Per CRNA, he was awake during vomiting episode and had a strong cough during suctioning and there was NOT any concern for aspiration event.  Pertinent  Medical History:  has Acute respiratory failure (HCC); COPD with acute exacerbation (HCC); and Stroke (cerebrum) (HCC) on their problem list.  Significant Hospital Events: Including procedures, antibiotic start and stop dates in addition to other pertinent events   5/6 admit, to [facility] for left M2 thrombectomy, L ICA stent placement with angioplasty.  Interim History / Subjective:  Sedated on Propofol.  Objective:  Blood pressure (!) 181/99, pulse 88, temperature 98 F (36.7 C), resp. rate 18, height 5' 9.5" (1.765 m), weight 82.8 kg, SpO2 95%.     Vent Mode: PRVC FiO2 (%):  [30 %] 30 % Set Rate:  [16 bmp] 16 bmp Vt Set:  [570 mL] 570 mL PEEP:  [5 cmH20] 5 cmH20 Plateau Pressure:  [20 cmH20] 20 cmH20   Intake/Output Summary (Last 24 hours) at 06/27/2023 2308 Last data filed at 06/27/2023 2237 Gross per 24 hour  Intake 1000 ml  Output 15 ml  Net 985 ml   Filed Weights   06/27/23 1900  Weight: 82.8 kg    Examination: General: Adult male, resting in bed, in NAD. Neuro: Sedated, not responsive. HEENT: [place]/AT. Sclerae anicteric. ETT in place. Cardiovascular: RRR, no M/R/G.  Lungs: Respirations even and unlabored.  CTA bilaterally, No W/R/R. Abdomen: BS x 4, soft, NT/ND.  Musculoskeletal: No gross deformities, no edema.   Labs/imaging personally reviewed:  ET head 5/6 > hyperdense left M1/M2 MCA concerning for thrombus.  No hemorrhage, no evidence of acute large vascular territory infarct.  Aspects 10. CTA head/neck 5/6 > occluded left ICA and left MCA, approximately 40% stenosis of the proximal right ICA, moderate stenosis of the right vertebral artery, moderate stenosis of the right brachiocephalic and left subclavian arteries.  Assessment & Plan:   L ICA and L MCAocclusion - s/p tPA followed by L M2 thrombectomy, L ICA stent placement with angioplasty. - Post procedure management per IR. - Stroke workup / management per neuro. - ASA/Brilinta/Heparin for 6 hours. - F/u on CT head, MRI brain, echo.  Hx HTN, HLD. - Cleviprex PRN for goal SBP 120 -  140 per neuro. - Hold PTA Toprol-XL. - F/u lipid panel.  Respiratory insufficiency - due to inability to protect the airway in the setting of above. Hx COPD. - Full vent support. - Wean as able. - Daily SBT, hopeful for extubation in AM. - Triple nebulizer therapy. - Bronchial hygiene. - Follow CXR.  Hx DM. - SSI. - Hold PTA Metformin (family states he takes this, but not on current MAR. Have placed pharmacy consult to help with new/accurate med rec).  Hx Parkinsons  Disease, PTSD. - Continue PTA Sinemet. - Hold PTA Gabapentin, Hydroxyzine, Ropinirole, Topiramate.   Best practice (evaluated daily):  Diet/type: NPO DVT prophylaxis: systemic heparin Pressure ulcer(s): pressure ulcer assessment deferred  GI prophylaxis: PPI Lines: Arterial Line Foley:  Yes, and it is still needed Code Status:  full code Last date of multidisciplinary goals of care discussion: None yet.  Labs   CBC: Recent Labs  Lab 06/27/23 1954 06/27/23 2000  WBC 11.7*  --   NEUTROABS 9.8*  --   HGB 14.1 14.3  HCT 42.2 42.0  MCV 96.3  --   PLT 179  --     Basic Metabolic Panel: Recent Labs  Lab 06/27/23 1954 06/27/23 2000  NA 138 141  K 4.5 4.5  CL 105 104  CO2 24  --   GLUCOSE 139* 137*  BUN 20 26*  CREATININE 1.40* 1.40*  CALCIUM 8.7*  --    GFR: Estimated Creatinine Clearance: 44.9 mL/min (A) (by C-G formula based on SCr of 1.4 mg/dL (H)). Recent Labs  Lab 06/27/23 1954  WBC 11.7*    Liver Function Tests: Recent Labs  Lab 06/27/23 1954  AST 19  ALT 15  ALKPHOS 61  BILITOT 0.5  PROT 6.4*  ALBUMIN 3.4*   No results for input(s): "LIPASE", "AMYLASE" in the last 168 hours. No results for input(s): "AMMONIA" in the last 168 hours.  ABG    Component Value Date/Time   TCO2 26 06/27/2023 2000     Coagulation Profile: Recent Labs  Lab 06/27/23 1954  INR 1.0    Cardiac Enzymes: No results for input(s): "CKTOTAL", "CKMB", "CKMBINDEX", "TROPONINI" in the last 168 hours.  HbA1C: No results found for: "HGBA1C"  CBG: No results for input(s): "GLUCAP" in the last 168 hours.  Review of Systems:   Unable to obtain as pt is encephalopathic.  Past Medical History:  He,  has a past medical history of Arthritis, COPD (chronic obstructive pulmonary disease) (HCC), Hyperlipidemia, Hypertension, Parkinson's disease, and PTSD (post-traumatic stress disorder).   Surgical History:   Past Surgical History:  Procedure Laterality Date   BACK  SURGERY       Social History:   reports that he has been smoking cigarettes. He has a 50 pack-year smoking history. He has never used smokeless tobacco. He reports that he does not currently use alcohol. He reports that he does not currently use drugs.   Family History:  His family history includes Asthma in his son.   Allergies Allergies  Allergen Reactions   Indomethacin Other (See Comments)    Other reaction(s): Other (See Comments) Ear ringing      Home Medications  Prior to Admission medications   Medication Sig Start Date End Date Taking? Authorizing Provider  albuterol  (PROVENTIL ) (2.5 MG/3ML) 0.083% nebulizer solution Take 3 mLs (2.5 mg total) by nebulization every 6 (six) hours as needed for wheezing or shortness of breath. 07/21/21   Aleck Hurdle, MD  albuterol  (VENTOLIN  HFA) 108 [phone]  Base) MCG/ACT inhaler INHALE 2 PUFFS BY MOUTH FOUR TIMES A DAY AS NEEDED FOR WHEEZING, SHORTNESS OF BREATHE, AND COUGH 11/23/20   [provider]  Budeson-Glycopyrrol-Formoterol (BREZTRI  AEROSPHERE) 160-9-4.8 MCG/ACT AERO Inhale 2 puffs into the lungs in the morning and at bedtime. 10/07/22   Cobb, Mariah Shines, NP  carbidopa-levodopa (SINEMET IR) 10-100 MG tablet Take 1 tablet by mouth 3 (three) times daily.    [provider]  celecoxib (CELEBREX) 200 MG capsule Take 1 tablet by mouth daily. 01/12/10   [provider]  ezetimibe (ZETIA) 10 MG tablet TAKE ONE-HALF TABLET BY MOUTH DAILY FOR CHOLESTEROL CONTROL 11/23/20   [provider]  gabapentin (NEURONTIN) 600 MG tablet Take by mouth.    [provider]  hydrOXYzine (ATARAX) 10 MG tablet Take by mouth.    [provider]  metoprolol succinate (TOPROL-XL) 50 MG 24 hr tablet Take by mouth.    [provider]  omeprazole (PRILOSEC) 20 MG capsule Take by mouth.    [provider]  predniSONE  (DELTASONE ) 10 MG tablet 4 tabs for 2 days, then 3 tabs for 2 days, 2 tabs for 2 days,  then 1 tab for 2 days, then stop 10/07/22   Cobb, Katherine V, NP  pyridoxine (B-6) 100 MG tablet Take by mouth.    [provider]  ranitidine (ZANTAC) 300 MG capsule Take by mouth.    [provider]  rOPINIRole (REQUIP) 0.25 MG tablet Take by mouth.    [provider]  topiramate (TOPAMAX) 100 MG tablet Take by mouth.    [provider]     Critical care time: 35 min.   Rafael Bun, PA - [facility] [place] [facility] For pager details, please see AMION or use Epic chat  After 1900, please call [facility] for cross coverage needs 06/27/2023, 11:08 PM

## 2023-06-27 NOTE — Anesthesia Procedure Notes (Signed)
 Procedure Name: Intubation Date/Time: 06/27/2023 8:48 PM  Performed by: Chabeli Barsamian C., CRNAPre-anesthesia Checklist: Patient identified, Suction available, Emergency Drugs available, Patient being monitored and Timeout performed Patient Re-evaluated:Patient Re-evaluated prior to induction Oxygen Delivery Method: Circle system utilized Preoxygenation: Pre-oxygenation with 100% oxygen Induction Type: IV induction, Rapid sequence and Cricoid Pressure applied Laryngoscope Size: Mac and 4 Grade View: Grade I Tube type: Oral Tube size: 7.5 mm Number of attempts: 1 Airway Equipment and Method: Stylet Placement Confirmation: ETT inserted through vocal cords under direct vision, positive ETCO2 and breath sounds checked- equal and bilateral Secured at: 22 cm Tube secured with: Tape Dental Injury: Teeth and Oropharynx as per pre-operative assessment

## 2023-06-27 NOTE — Code Documentation (Signed)
 Stroke Response Nurse Documentation Code Documentation  Rickey Baker is a 78 y.o. male arriving to [facility]  via [facility] on 5/6 with past medical hx of HTN, HLD, parkinsons. On No antithrombotic. Code stroke was activated by EMS.   Patient from home where he was LKW at 1700 and now complaining of right sided weakness and aphasia.   Stroke team at the bedside on patient arrival. Labs drawn and patient cleared for CT by Dr.  Nora Beal. Patient to CT with team. NIHSS 24, see documentation for details and code stroke times. Patient with disoriented, not following commands, left gaze preference , right hemianopia, right facial droop, bilateral arm weakness, bilateral leg weakness, right decreased sensation, Global aphasia , and dysarthria  on exam. The following imaging was completed:  CT Head and CTA. Patient is a candidate for IV Thrombolytic due to fixed neurological deficit. Patient is a candidate for IR due to Left ICA/MCA occlusion.   Care Plan: TNKase and mechanical thrombectomy.   Process Delays Noted: None  Bedside handoff with IR RN Sherrie.    Dodson Freestone  Rapid Response RN

## 2023-06-27 NOTE — Anesthesia Procedure Notes (Signed)
 Arterial Line Insertion Start/End5/07/2023 8:44 AM, 06/27/2023 8:50 PM Performed by: Jonne Netters, MD, anesthesiologist  Patient location: OOR procedure area. Preanesthetic checklist: patient identified, IV checked, risks and benefits discussed, monitors and equipment checked, pre-op evaluation, timeout performed and anesthesia consent Emergency situation Left, radial was placed Catheter size: 20 G Hand hygiene performed   Attempts: 1 Procedure performed without using ultrasound guided technique. Following insertion, dressing applied and Biopatch. Post procedure assessment: normal  Patient tolerated the procedure well with no immediate complications.

## 2023-06-27 NOTE — Progress Notes (Signed)
 Started cleviprex for BP

## 2023-06-27 NOTE — Progress Notes (Signed)
 PHARMACIST CODE STROKE RESPONSE  Notified to mix TNK at 20:04 by Dr. Alecia Ames  TNK preparation completed at 20:06  TNK dose = 21 mg IV over 5 seconds (4.33mL)   Issues/delays encountered (if applicable): none   Rickey Baker, PharmD, BCCCP  06/27/23 8:07 PM

## 2023-06-27 NOTE — Addendum Note (Signed)
 Addendum  created 06/27/23 2355 by Keionna Kinnaird C., CRNA   Intraprocedure Event edited, LDA properties accepted

## 2023-06-27 NOTE — Transfer of Care (Signed)
 Immediate Anesthesia Transfer of Care Note  Patient: Rickey Baker  Procedure(s) Performed: RADIOLOGY WITH ANESTHESIA  Patient Location: ICU  Anesthesia Type:General  Level of Consciousness: sedated and Patient remains intubated per anesthesia plan  Airway & Oxygen Therapy: Patient remains intubated per anesthesia plan and Patient placed on Ventilator (see vital sign flow sheet for setting)  Post-op Assessment: Report given to RN and Post -op Vital signs reviewed and stable  Post vital signs: Reviewed and stable  Last Vitals:  Vitals Value Taken Time  BP 152/137 06/27/23 2300  Temp    Pulse 71 06/27/23 2305  Resp 15 06/27/23 2305  SpO2 100 % 06/27/23 2305  Vitals shown include unfiled device data.  Last Pain: There were no vitals filed for this visit.       Complications: No notable events documented.

## 2023-06-27 NOTE — Anesthesia Preprocedure Evaluation (Addendum)
 Anesthesia Evaluation  Patient identified by MRN, date of birth, ID band Patient awake    Reviewed: Allergy & Precautions, NPO status , Patient's Chart, lab work & pertinent test resultsPreop documentation limited or incomplete due to emergent nature of procedure.  History of Anesthesia Complications Negative for: history of anesthetic complications  Airway Mallampati: Unable to assess  TM Distance: >3 FB     Dental  (+) Missing   Pulmonary COPD,  COPD inhaler, Current Smoker   breath sounds clear to auscultation       Cardiovascular hypertension, Pt. on medications and Pt. on home beta blockers  Rhythm:Regular Rate:Normal     Neuro/Psych  PSYCHIATRIC DISORDERS (PTSD) Anxiety     Acute CVA: aphasic with a left-sided gaze preference, right greater than left sided weakness and a right sided facial droop Last normal 4pm  Parkinson's CVA (acute)    GI/Hepatic ,GERD  ,,emesis   Endo/Other  negative endocrine ROS    Renal/GU      Musculoskeletal   Abdominal   Peds  Hematology Hb 14.3, plt 179k   Anesthesia Other Findings   Reproductive/Obstetrics                              Anesthesia Physical Anesthesia Plan  ASA: 3 and emergent  Anesthesia Plan: General   Post-op Pain Management: Minimal or no pain anticipated   Induction: Intravenous  PONV Risk Score and Plan: 1 and Ondansetron and Dexamethasone  Airway Management Planned: Oral ETT  Additional Equipment: Arterial line  Intra-op Plan:   Post-operative Plan: Possible Post-op intubation/ventilation  Informed Consent:      Only emergency history available and History available from chart only  Plan Discussed with: CRNA and Surgeon  Anesthesia Plan Comments: (Discussed with IR and rapid response teams)         Anesthesia Quick Evaluation

## 2023-06-27 NOTE — Anesthesia Postprocedure Evaluation (Signed)
 Anesthesia Post Note  Patient: Rickey Baker  Procedure(s) Performed: RADIOLOGY WITH ANESTHESIA     Patient location during evaluation: ICU Anesthesia Type: General Level of consciousness: patient remains intubated per anesthesia plan and sedated Pain management: pain level controlled Vital Signs Assessment: post-procedure vital signs reviewed and stable Respiratory status: patient on ventilator - see flowsheet for VS and patient remains intubated per anesthesia plan Cardiovascular status: stable Postop Assessment: no apparent nausea or vomiting Anesthetic complications: no Comments: Remains intubated for airway protection.    No notable events documented.  Last Vitals:  Vitals:   06/27/23 2315 06/27/23 2330  BP:  129/82  Pulse: 64 63  Resp: 16 11  Temp:    SpO2: 100% 100%    Last Pain:  Vitals:   06/27/23 2300  TempSrc: Axillary                 Wilbern Pennypacker,E. Abdul Beirne

## 2023-06-27 NOTE — Progress Notes (Signed)
 eLink Physician-Brief Progress Note Patient Name: Rickey Baker DOB: [birth year] MRN: [id number]   Date of Service  06/27/2023  HPI/Events of Note  Patient admitted as a Code Stroke, she was transferred to the ICU intubated following thrombectomy.  eICU Interventions  New Patient Evaluation.        Kabeer Hoagland U Micheale Schlack 06/27/2023, 11:08 PM

## 2023-06-28 ENCOUNTER — Other Ambulatory Visit (HOSPITAL_COMMUNITY): Payer: Self-pay

## 2023-06-28 ENCOUNTER — Inpatient Hospital Stay (HOSPITAL_COMMUNITY)

## 2023-06-28 ENCOUNTER — Telehealth (HOSPITAL_COMMUNITY): Payer: Self-pay | Admitting: Pharmacy Technician

## 2023-06-28 DIAGNOSIS — I69391 Dysphagia following cerebral infarction: Secondary | ICD-10-CM | POA: Diagnosis not present

## 2023-06-28 DIAGNOSIS — E785 Hyperlipidemia, unspecified: Secondary | ICD-10-CM

## 2023-06-28 DIAGNOSIS — J9589 Other postprocedural complications and disorders of respiratory system, not elsewhere classified: Secondary | ICD-10-CM

## 2023-06-28 DIAGNOSIS — I6522 Occlusion and stenosis of left carotid artery: Secondary | ICD-10-CM

## 2023-06-28 DIAGNOSIS — Z95828 Presence of other vascular implants and grafts: Secondary | ICD-10-CM

## 2023-06-28 DIAGNOSIS — R0689 Other abnormalities of breathing: Secondary | ICD-10-CM | POA: Diagnosis not present

## 2023-06-28 DIAGNOSIS — I6389 Other cerebral infarction: Secondary | ICD-10-CM

## 2023-06-28 DIAGNOSIS — G20A1 Parkinson's disease without dyskinesia, without mention of fluctuations: Secondary | ICD-10-CM

## 2023-06-28 DIAGNOSIS — E119 Type 2 diabetes mellitus without complications: Secondary | ICD-10-CM | POA: Diagnosis not present

## 2023-06-28 DIAGNOSIS — I63512 Cerebral infarction due to unspecified occlusion or stenosis of left middle cerebral artery: Secondary | ICD-10-CM | POA: Diagnosis not present

## 2023-06-28 DIAGNOSIS — I10 Essential (primary) hypertension: Secondary | ICD-10-CM | POA: Diagnosis not present

## 2023-06-28 LAB — BASIC METABOLIC PANEL WITH GFR
Anion gap: 10 (ref 5–15)
BUN: 18 mg/dL (ref 8–23)
CO2: 23 mmol/L (ref 22–32)
Calcium: 7.5 mg/dL — ABNORMAL LOW (ref 8.9–10.3)
Chloride: 106 mmol/L (ref 98–111)
Creatinine, Ser: 1.09 mg/dL (ref 0.61–1.24)
GFR, Estimated: >60 mL/min (ref >60)
Glucose, Bld: 144 mg/dL — ABNORMAL HIGH (ref 70–99)
Potassium: 4 mmol/L (ref 3.5–5.1)
Sodium: 139 mmol/L (ref 135–145)

## 2023-06-28 LAB — ECHOCARDIOGRAM COMPLETE
Area-P 1/2: 6.96 cm2
Height: 69.5 in
S' Lateral: 4 cm
Weight: 2920.65 [oz_av]

## 2023-06-28 LAB — LIPID PANEL
Cholesterol: 182 mg/dL (ref 0–200)
HDL: 39 mg/dL — ABNORMAL LOW (ref >40)
LDL Cholesterol: 130 mg/dL — ABNORMAL HIGH (ref 0–99)
Total CHOL/HDL Ratio: 4.7 ratio
Triglycerides: 64 mg/dL (ref <150)
VLDL: 13 mg/dL (ref 0–40)

## 2023-06-28 LAB — CBC
HCT: 34.3 % — ABNORMAL LOW (ref 39.0–52.0)
Hemoglobin: 11.4 g/dL — ABNORMAL LOW (ref 13.0–17.0)
MCH: 32.2 pg (ref 26.0–34.0)
MCHC: 33.2 g/dL (ref 30.0–36.0)
MCV: 96.9 fL (ref 80.0–100.0)
Platelets: 162 10*3/uL (ref 150–400)
RBC: 3.54 MIL/uL — ABNORMAL LOW (ref 4.22–5.81)
RDW: 11.9 % (ref 11.5–15.5)
WBC: 11.1 10*3/uL — ABNORMAL HIGH (ref 4.0–10.5)
nRBC: 0 % (ref 0.0–0.2)

## 2023-06-28 LAB — RAPID URINE DRUG SCREEN, HOSP PERFORMED
Amphetamines: NOT DETECTED
Barbiturates: NOT DETECTED
Benzodiazepines: NOT DETECTED
Cocaine: NOT DETECTED
Opiates: NOT DETECTED
Tetrahydrocannabinol: NOT DETECTED

## 2023-06-28 LAB — GLUCOSE, CAPILLARY
Glucose-Capillary: 117 mg/dL — ABNORMAL HIGH (ref 70–99)
Glucose-Capillary: 119 mg/dL — ABNORMAL HIGH (ref 70–99)
Glucose-Capillary: 122 mg/dL — ABNORMAL HIGH (ref 70–99)
Glucose-Capillary: 123 mg/dL — ABNORMAL HIGH (ref 70–99)
Glucose-Capillary: 128 mg/dL — ABNORMAL HIGH (ref 70–99)
Glucose-Capillary: 161 mg/dL — ABNORMAL HIGH (ref 70–99)
Glucose-Capillary: 166 mg/dL — ABNORMAL HIGH (ref 70–99)

## 2023-06-28 LAB — HEMOGLOBIN A1C
Hgb A1c MFr Bld: 6.2 % — ABNORMAL HIGH (ref 4.8–5.6)
Mean Plasma Glucose: 131.24 mg/dL

## 2023-06-28 LAB — PHOSPHORUS: Phosphorus: 2.8 mg/dL (ref 2.5–4.6)

## 2023-06-28 LAB — MAGNESIUM: Magnesium: 1.7 mg/dL (ref 1.7–2.4)

## 2023-06-28 LAB — TRIGLYCERIDES: Triglycerides: 103 mg/dL (ref <150)

## 2023-06-28 LAB — MRSA NEXT GEN BY PCR, NASAL: MRSA by PCR Next Gen: NOT DETECTED

## 2023-06-28 MED ORDER — SODIUM CHLORIDE 0.9 % NICU IV INFUSION SIMPLE: 1000.0000 mL | INJECTION | Freq: Once | INTRAVENOUS | Status: DC

## 2023-06-28 MED ORDER — PERFLUTREN LIPID MICROSPHERE
1.0000 mL | INTRAVENOUS | Status: AC | PRN
  Administered 2023-06-28: 4 mL via INTRAVENOUS

## 2023-06-28 MED ORDER — MAGNESIUM SULFATE 2 GM/50ML IV SOLN
2.0000 g | Freq: Once | INTRAVENOUS | Status: AC
Start: 2023-06-28 — End: 2023-06-28
  Administered 2023-06-28: 2 g via INTRAVENOUS
  Filled 2023-06-28: qty 50

## 2023-06-28 MED ORDER — ATORVASTATIN CALCIUM 40 MG PO TABS
40.0000 mg | ORAL_TABLET | Freq: Every day | ORAL | Status: DC
  Administered 2023-06-28 – 2023-07-02 (×5): 40 mg
  Filled 2023-06-28 (×5): qty 1

## 2023-06-28 MED ORDER — LABETALOL HCL 5 MG/ML IV SOLN
10.0000 mg | INTRAVENOUS | Status: DC | PRN
  Filled 2023-06-28: qty 4

## 2023-06-28 MED ORDER — ASPIRIN 81 MG PO CHEW
81.0000 mg | CHEWABLE_TABLET | Freq: Every day | ORAL | Status: DC
  Administered 2023-06-28 – 2023-07-08 (×11): 81 mg
  Filled 2023-06-28 (×11): qty 1

## 2023-06-28 MED ORDER — SODIUM CHLORIDE 0.9 % IV SOLN: 250.0000 mL | INTRAVENOUS | Status: AC

## 2023-06-28 MED ORDER — NOREPINEPHRINE 4 MG/250ML-% IV SOLN: 2.0000 ug/min | INTRAVENOUS | Status: DC

## 2023-06-28 MED ORDER — SODIUM CHLORIDE 0.9 % IV SOLN: INTRAVENOUS | Status: DC

## 2023-06-28 MED ORDER — TICAGRELOR 90 MG PO TABS
90.0000 mg | ORAL_TABLET | Freq: Two times a day (BID) | ORAL | Status: DC
  Administered 2023-06-28 – 2023-07-08 (×21): 90 mg
  Filled 2023-06-28 (×21): qty 1

## 2023-06-28 MED ORDER — SODIUM CHLORIDE 0.9 % IV BOLUS
500.0000 mL | Freq: Once | INTRAVENOUS | Status: AC
  Administered 2023-06-28: 500 mL via INTRAVENOUS

## 2023-06-28 MED ORDER — HYDRALAZINE HCL 20 MG/ML IJ SOLN
10.0000 mg | INTRAMUSCULAR | Status: DC | PRN
  Administered 2023-06-28 – 2023-07-01 (×4): 20 mg via INTRAVENOUS
  Filled 2023-06-28 (×5): qty 1

## 2023-06-28 MED ORDER — LABETALOL HCL 5 MG/ML IV SOLN
10.0000 mg | INTRAVENOUS | Status: DC | PRN
Start: 2023-06-28 — End: 2023-07-03
  Administered 2023-06-28 – 2023-07-02 (×5): 20 mg via INTRAVENOUS
  Filled 2023-06-28 (×4): qty 4

## 2023-06-28 NOTE — Progress Notes (Signed)
 Messaged Elink MD about BP

## 2023-06-28 NOTE — Progress Notes (Addendum)
 STROKE TEAM PROGRESS NOTE   INTERIM HISTORY/SUBJECTIVE He presented yesterday with aphasia right hemiplegia due to left carotid and MCA occlusion and underwent treatment with TNK followed by successful mechanical thrombectomy but requiring rescue left ICA stenting.  Remains sedated and intubated.  Is unresponsive on exam with trace withdrawal in bilateral lower extremities and left upper extremity.  MRI done today, will need 24 hour CT this evening. ASA and brilinta continued. Wean with goal of extubation this afternoon.   OBJECTIVE  CBC    Component Value Date/Time   WBC 11.1 (H) 06/28/2023 0028   RBC 3.54 (L) 06/28/2023 0028   HGB 11.4 (L) 06/28/2023 0028   HCT 34.3 (L) 06/28/2023 0028   PLT 162 06/28/2023 0028   MCV 96.9 06/28/2023 0028   MCH 32.2 06/28/2023 0028   MCHC 33.2 06/28/2023 0028   RDW 11.9 06/28/2023 0028   LYMPHSABS 0.7 06/27/2023 1954   MONOABS 0.8 06/27/2023 1954   EOSABS 0.2 06/27/2023 1954   BASOSABS 0.1 06/27/2023 1954    BMET    Component Value Date/Time   NA 139 06/28/2023 0028   K 4.0 06/28/2023 0028   CL 106 06/28/2023 0028   CO2 23 06/28/2023 0028   GLUCOSE 144 (H) 06/28/2023 0028   BUN 18 06/28/2023 0028   CREATININE 1.09 06/28/2023 0028   CALCIUM 7.5 (L) 06/28/2023 0028   GFRNONAA >60 06/28/2023 0028    IMAGING past 24 hours CT ANGIO HEAD NECK W WO CM (CODE STROKE) Result Date: 06/27/2023 CLINICAL DATA:  Neuro deficit, acute, stroke suspected EXAM: CT ANGIOGRAPHY HEAD AND NECK WITH AND WITHOUT CONTRAST TECHNIQUE: Multidetector CT imaging of the head and neck was performed using the standard protocol during bolus administration of intravenous contrast. Multiplanar CT image reconstructions and MIPs were obtained to evaluate the vascular anatomy. Carotid stenosis measurements (when applicable) are obtained utilizing NASCET criteria, using the distal internal carotid diameter as the denominator. RADIATION DOSE REDUCTION: This exam was performed  according to the departmental dose-optimization program which includes automated exposure control, adjustment of the mA and/or kV according to patient size and/or use of iterative reconstruction technique. CONTRAST:  75mL OMNIPAQUE  IOHEXOL  350 MG/ML SOLN COMPARISON:  Same day CT head. FINDINGS: CTA NECK FINDINGS Aortic arch: Aortic atherosclerosis. Great vessel origins are patent. Moderate narrowing of the brachiocephalic artery and proximal left subclavian artery. Right carotid system: Approximately 40% stenosis of the proximal ICA due to atherosclerosis. Left carotid system: Common carotid artery is patent. Occlusion of the proximal ICA with non opacification throughout the neck. Vertebral arteries: Left dominant. Left vertebral artery is patent without significant stenosis. Right vertebral artery is patent with multifocal moderate narrowing. Skeleton: No acute fracture on limited assessment. Other neck: No acute abnormality on limited assessment. Upper chest: Emphysema. Review of the MIP images confirms the above findings CTA HEAD FINDINGS Anterior circulation: Largely non-opacified intracranial ICA with small amount of contrast in the carotid terminus where intraluminal clot is visible. Clot extends into the left M1 and proximal M2 MCA branches which are largely occluded. The right MCA and bilateral ACAs are patent. Posterior circulation: Bilateral intradural vertebral arteries, basilar artery and bilateral posterior cerebral arteries are patent without proximal hemodynamically significant stenosis. Right fetal type PCA. Venous sinuses: As permitted by contrast timing, patent. Review of the MIP images confirms the above findings IMPRESSION: 1. Occluded left ICA and left MCA, as detailed above. 2. Approximately 40% stenosis of the proximal right ICA. 3. Moderate stenosis of the right vertebral artery. 4. Moderate  stenosis of the right brachiocephalic and left subclavian arteries. 5. Aortic Atherosclerosis  (ICD10-I70.0) and Emphysema (ICD10-J43.9). Code stroke imaging results were communicated on 06/27/2023 at 8:19 pm to provider Dr. Alecia Ames via telephone, who verbally acknowledged these results. Electronically Signed   By: Stevenson Elbe M.D.   On: 06/27/2023 20:34   CT HEAD CODE STROKE WO CONTRAST Result Date: 06/27/2023 CLINICAL DATA:  Code stroke.  Neuro deficit, acute, stroke suspected EXAM: CT HEAD WITHOUT CONTRAST TECHNIQUE: Contiguous axial images were obtained from the base of the skull through the vertex without intravenous contrast. RADIATION DOSE REDUCTION: This exam was performed according to the departmental dose-optimization program which includes automated exposure control, adjustment of the mA and/or kV according to patient size and/or use of iterative reconstruction technique. COMPARISON:  None Available. FINDINGS: Brain: No evidence of acute large vascular territory infarction, hemorrhage, hydrocephalus, extra-axial collection or mass lesion/mass effect. Patchy white matter hypodensities, compatible with chronic microvascular ischemic disease. Vascular: No hyperdense vessel or unexpected calcification. Skull: No acute fracture. Sinuses/Orbits: No acute orbital findings.  Clear sinuses. ASPECTS [facility] Stroke Program Early CT Score) Total score (0-10 with 10 being normal): 10. IMPRESSION: 1. Hyperdense left M1/M2 MCA concerning for thrombus. See forthcoming CTA. 2. No evidence of acute large vascular territory infarct or acute hemorrhage. ASPECTS 10. Code stroke imaging results were communicated on 06/27/2023 at 8:19 pm to provider Dr. Alecia Ames via telephone, who verbally acknowledged these results. Electronically Signed   By: Stevenson Elbe M.D.   On: 06/27/2023 20:21    Vitals:   06/28/23 0645 06/28/23 0700 06/28/23 0738 06/28/23 0800  BP: 105/64 112/69    Pulse: 63 62    Resp: 18 16    Temp:    98.4 F (36.9 C)  TempSrc:    Oral  SpO2: 94% 94% 95%   Weight:      Height:          PHYSICAL EXAM General:  Alert, well-nourished, well-developed patient in no acute distress Psych:  Mood and affect appropriate for situation CV: Regular rate and rhythm on monitor Respiratory:  Regular, unlabored respirations on room air GI: Abdomen soft and nontender   NEURO:  Mental Status: Sedated, intubated Speech/Language: Globally aphasic.  Unresponsive.  Cranial Nerves:  II: Pupils equal, sluggish III, IV, VI: eyes midline V:  VII: face obstructed by ETT VIII: non response to verbal stimuli  IX, X: Cough and gag intact UJ:WJXB midline XII: tongue is midline without fasciculations. Motor: no purposeful movement noted due to sedation Tone: is normal and bulk is normal Sensation- withdraws to pain bilaterally in lower extremities  Coordination: not tested Gait- deferred   ASSESSMENT/PLAN  Rickey Baker is a 78 y.o. male with history of hypertension, hyperlipidemia, Parkinson's disease who presents with right-sided weakness and aphasia .  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 Left M2 occlusion with successful suction thrombectomy   Code Stroke CT head Hyperdense left M1/M2 MCA concerning for thrombus. ASPECTS 10.   CTA head & neck  Occluded left ICA and left MCA, as detailed above. Approximately 40% stenosis of the proximal right ICA. MRI   Acute infarct affecting much of the left caudate and lentiform nuclei. Patchy acute cortically-based infarcts scattered within the frontal, parietal and temporal lobes on the left (MCA and ACA vascular territories). Additional small acute infarct  along the atrium of the left lateral ventricle. Punctate acute cortical infarct within the perirolandic medial right frontoparietal lobes.  2D Echo Pending  LDL 130 HgbA1c 6.2 VTE prophylaxis - SCDs No  antithrombotic prior to admission, now on aspirin 81 mg daily and Brilinta (ticagrelor) 90 mg bid. Therapy recommendations:  No follow up needed  Disposition:  PRN  Respiratory insufficiency PCCM consulted and management of ventilator VAP/PPI, nebs ordered Wean as able  Hypertension Blood Pressure Goal: SBP 120-160 for first 24 hours then less than 180  IV cleviprex if needed PRN hydralazine and labetalol   Hyperlipidemia Home meds:  Zetia, resumed in hospital LDL 130, goal < 70 Add atorvastatin 80mg  Continue statin at discharge  Diabetes type II Controlled HgbA1c 6.2, goal < 7.0 CBGs SSI Recommend close follow-up with PCP for better DM control  Dysphagia Patient has post-stroke dysphagia, SLP consulted    Diet   Diet NPO time specified   Advance diet as tolerated  Other Stroke Risk Factors Obesity, Body mass index is 26.57 kg/m., BMI >/= 30 associated with increased stroke risk, recommend weight loss, diet and exercise as appropriate   Other Active Problems Parkinson's disease Sinemet resumed Gabapentin, Hydroxyzine, Ropinirole, Topiramate - on hold until med rec is done  Hospital day # 1  Patient seen and examined by NP/APP with MD. MD to update note as needed.   Imogene Mana, DNP, FNP-BC [facility] Pager: [phone]  I have personally obtained history,examined this patient, reviewed notes, independently viewed imaging studies, participated in medical decision making and plan of care.ROS completed by me personally and pertinent positives fully documented  I have made any additions or clarifications directly to the above note. Agree with note above.  He presented with aphasia right hemiparesis due to left carotid and left MCA occlusion requiring mechanical thrombectomy with residual left proximal ICA stenting.  MRI shows patchy left MCA infarct without hemorrhagic transformation.  Continue close neurological observation and strict blood pressure  control as per post TNK and thrombectomy protocol.  Wean off sedation and extubate as tolerated later today.  Continue aspirin and Brilinta for carotid stent.  Continue ongoing stroke workup.  Long discussion at the bedside with the patient's wife and daughter and answered questions about the prognosis and treatment.  Discussed with Dr. Zaida Hertz critical care medicine. This patient is critically ill and at significant risk of neurological worsening, death and care requires constant monitoring of vital signs, hemodynamics,respiratory and cardiac monitoring, extensive review of multiple databases, frequent neurological assessment, discussion with family, other specialists and medical decision making of high complexity.I have made any additions or clarifications directly to the above note.This critical care time does not reflect procedure time, or teaching time or supervisory time of PA/NP/Med Resident etc but could involve care discussion time.  I spent 40 minutes of neurocritical care time  in the care of  this patient.     Ardella Beaver, MD Medical Director [facility] Pager: [phone] 06/28/2023 4:12 PM   To contact Stroke Continuity provider, please refer to [email or web address]. After hours, contact General Neurology

## 2023-06-28 NOTE — Progress Notes (Addendum)
 eLink Physician-Brief Progress Note Patient Name: Rickey Baker DOB: 10/30/1945 MRN: [id number]   Date of Service  06/28/2023  HPI/Events of Note  BP is 108/57 after fluid bolus.  eICU Interventions  Peripheral Levophed gtt ordered to maintain SBP between 120 - 140 per neurology parameters. ADDENDUM SBP now within parameters before Levophed gtt was started, will discontinue peripheral Levophed order for now.        Rickey Baker U Rickey Baker 06/28/2023, 3:31 AM

## 2023-06-28 NOTE — Progress Notes (Signed)
 Pt placed on SBT, tolerating well.  06/28/23 0738  Adult Ventilator Settings  Vent Mode PSV;CPAP  FiO2 (%) 30 %  Pressure Support 5 cmH20  PEEP 5 cmH20  Adult Ventilator Measurements  Peak Airway Pressure 11 L/min  Mean Airway Pressure 7 cmH20  Resp Rate Spontaneous 16 br/min  Resp Rate Total 16 br/min  Spont TV 509 mL  Measured Ve 9.2 L  SpO2 95 %

## 2023-06-28 NOTE — TOC CAGE-AID Note (Signed)
 Transition of Care [facility]) - CAGE-AID Screening   Patient Details  Name: Rickey Baker MRN: [id number] Date of Birth: August 19, 1945  [facility] CM/SW Contact:    Verdell Kincannon E Romone Shaff, LCSW Phone Number: 06/28/2023, 9:41 AM   Clinical Narrative: Per chart review - patient currently intubated.   CAGE-AID Screening: Substance Abuse Screening unable to be completed due to: : Patient unable to participate (patient intubated)

## 2023-06-28 NOTE — Plan of Care (Signed)
 Problem: Ischemic Stroke/TIA Tissue Perfusion: Goal: Complications of ischemic stroke/TIA will be minimized Outcome: Progressing   Problem: Health Behavior/Discharge Planning: Goal: Goals will be collaboratively established with patient/family Outcome: Progressing   Problem: Nutrition: Goal: Risk of aspiration will decrease Outcome: Progressing   Problem: Clinical Measurements: Goal: Ability to maintain clinical measurements within normal limits will improve Outcome: Progressing Goal: Will remain free from infection Outcome: Progressing Goal: Diagnostic test results will improve Outcome: Progressing Goal: Respiratory complications will improve Outcome: Progressing Goal: Cardiovascular complication will be avoided Outcome: Progressing   Problem: Activity: Goal: Risk for activity intolerance will decrease Outcome: Progressing   Problem: Coping: Goal: Level of anxiety will decrease Outcome: Progressing   Problem: Elimination: Goal: Will not experience complications related to urinary retention Outcome: Progressing   Problem: Pain Managment: Goal: General experience of comfort will improve and/or be controlled Outcome: Progressing   Problem: Safety: Goal: Ability to remain free from injury will improve Outcome: Progressing   Problem: Skin Integrity: Goal: Risk for impaired skin integrity will decrease Outcome: Progressing   Problem: Cardiovascular: Goal: Ability to achieve and maintain adequate cardiovascular perfusion will improve Outcome: Progressing Goal: Vascular access site(s) Level 0-1 will be maintained Outcome: Progressing   Problem: Fluid Volume: Goal: Ability to maintain a balanced intake and output will improve Outcome: Progressing   Problem: Nutritional: Goal: Progress toward achieving an optimal weight will improve Outcome: Progressing   Problem: Skin Integrity: Goal: Risk for impaired skin integrity will decrease Outcome: Progressing    Problem: Tissue Perfusion: Goal: Adequacy of tissue perfusion will improve Outcome: Progressing   Problem: Activity: Goal: Ability to tolerate increased activity will improve Outcome: Progressing   Problem: Education: Goal: Knowledge of disease or condition will improve Outcome: Not Progressing Goal: Knowledge of secondary prevention will improve (MUST DOCUMENT ALL) Outcome: Not Progressing Goal: Knowledge of patient specific risk factors will improve (DELETE if not current risk factor) Outcome: Not Progressing   Problem: Coping: Goal: Will verbalize positive feelings about self Outcome: Not Progressing Goal: Will identify appropriate support needs Outcome: Not Progressing   Problem: Health Behavior/Discharge Planning: Goal: Ability to manage health-related needs will improve Outcome: Not Progressing   Problem: Self-Care: Goal: Verbalization of feelings and concerns over difficulty with self-care will improve Outcome: Not Progressing Goal: Ability to communicate needs accurately will improve Outcome: Not Progressing   Problem: Nutrition: Goal: Dietary intake will improve Outcome: Not Progressing   Problem: Education: Goal: Knowledge of General Education information will improve Description: Including pain rating scale, medication(s)/side effects and non-pharmacologic comfort measures Outcome: Not Progressing   Problem: Health Behavior/Discharge Planning: Goal: Ability to manage health-related needs will improve Outcome: Not Progressing   Problem: Nutrition: Goal: Adequate nutrition will be maintained Outcome: Not Progressing   Problem: Elimination: Goal: Will not experience complications related to bowel motility Outcome: Not Progressing   Problem: Education: Goal: Understanding of CV disease, CV risk reduction, and recovery process will improve Outcome: Not Progressing Goal: Individualized Educational Video(s) Outcome: Not Progressing   Problem:  Activity: Goal: Ability to return to baseline activity level will improve Outcome: Not Progressing   Problem: Health Behavior/Discharge Planning: Goal: Ability to safely manage health-related needs after discharge will improve Outcome: Not Progressing   Problem: Education: Goal: Ability to describe self-care measures that may prevent or decrease complications (Diabetes Survival Skills Education) will improve Outcome: Not Progressing Goal: Individualized Educational Video(s) Outcome: Not Progressing   Problem: Coping: Goal: Ability to adjust to condition or change in health will improve Outcome: Not Progressing  Problem: Health Behavior/Discharge Planning: Goal: Ability to identify and utilize available resources and services will improve Outcome: Not Progressing Goal: Ability to manage health-related needs will improve Outcome: Not Progressing   Problem: Metabolic: Goal: Ability to maintain appropriate glucose levels will improve Outcome: Not Progressing   Problem: Nutritional: Goal: Maintenance of adequate nutrition will improve Outcome: Not Progressing

## 2023-06-28 NOTE — Progress Notes (Signed)
 PT Cancellation Note  Patient Details Name: Rickey Baker MRN: [id number] DOB: 1945/05/24   Cancelled Treatment:    Reason Eval/Treat Not Completed: (P) Active bedrest order. RN reports plan is for MRI and cortrak this AM and then potentially attempt extubation this PM. Will plan to hold off on therapy at this time and follow-up later as time permits and once activity orders are progressed.  Vernida Goodie, PT, DPT Acute Rehabilitation Services  Office: [phone]    Ellyn Hack 06/28/2023, 9:41 AM

## 2023-06-28 NOTE — Progress Notes (Signed)
 Left radial A-line very malpositioned after CT visit. Removed a-line without incident.

## 2023-06-28 NOTE — Procedures (Signed)
 Extubation Procedure Note  Patient Details:   Name: Rickey Baker DOB: [birth year] MRN: [id number]   Airway Documentation:    Vent end date: 06/28/23 Vent end time: 1710   Evaluation  O2 sats: stable throughout Complications: No apparent complications Patient did tolerate procedure well. Bilateral Breath Sounds: Expiratory wheezes   No  Patient was extubated to a 4L [place] without any complications, dyspnea or stridor noted. Positive cuff leak noted prior to extubation.   Royalty Fakhouri, Benny Braver 06/28/2023, 5:10 PM

## 2023-06-28 NOTE — Progress Notes (Signed)
 NAME:  Rickey Baker, MRN:  [id number], DOB:  06/01/1945, LOS: 1 ADMISSION DATE:  06/27/2023, CONSULTATION DATE:  06/27/23 REFERRING MD:  Alecia Ames CHIEF COMPLAINT:  Vent Management   History of Present Illness:  Pt is encephelopathic; therefore, this HPI is obtained from chart review. Rickey Baker is a 78 y.o. male who has a PMH as below clued but not limited to hypertension, hyperlipidemia, Parkinson disease.  He presented to [facility] ED on 06/27/2023 with right-sided weakness and aphasia.  He was in his normal state of health earlier in the evening when his wife helped him lie down for a nap around 5 PM.  When she woke up later, she found that he was slumped to the side and unable to talk.  She called 911 and he was brought to the ED as a code stroke.  CT revealed no hemorrhage, CTA confirmed a left ICA/MCA occlusion.  He did receive TNKase at 2004.  He was then taken to neuro IR where he had a left M2 thrombectomy, L ICA stent placement with angioplasty performed.  Just prior to the procedure he apparently had a vomiting episode requiring suctioning therefore, following the procedure, he remained intubated and was transferred to the ICU PCCM was subsequently called in consultation for vent management. Per CRNA, he was awake during vomiting episode and had a strong cough during suctioning and there was NOT any concern for aspiration event.  Pertinent  Medical History:  has Acute hypoxic respiratory failure (HCC); COPD with acute exacerbation (HCC); Stroke (cerebrum) (HCC); and Hypertension associated with diabetes (HCC) on their problem list.  Significant Hospital Events: Including procedures, antibiotic start and stop dates in addition to other pertinent events   5/6 admit, to [facility] for left M2 thrombectomy, L ICA stent placement with angioplasty.  Interim History / Subjective:  MRI this am No events overnight  Objective:  Blood pressure 112/69, pulse 62, temperature (!) 96.1 F (35.6 C),  temperature source Axillary, resp. rate 16, height 5' 9.5" (1.765 m), weight 82.8 kg, SpO2 95%.    Vent Mode: PSV;CPAP FiO2 (%):  [30 %] 30 % Set Rate:  [16 bmp] 16 bmp Vt Set:  [570 mL] 570 mL PEEP:  [5 cmH20] 5 cmH20 Pressure Support:  [5 cmH20] 5 cmH20 Plateau Pressure:  [20 cmH20] 20 cmH20   Intake/Output Summary (Last 24 hours) at 06/28/2023 4098 Last data filed at 06/28/2023 0700 Gross per 24 hour  Intake 2660 ml  Output 15 ml  Net 2645 ml   Filed Weights   06/27/23 1900  Weight: 82.8 kg    Examination: Propofol 25 General:  critically ill elderly male in NAD on MV HEENT: MM pink/moist, ETT/ OGT, pupils 3/r Neuro: attempts to open eyes, not f/c, some spont movement of LUE, withdrawals to noxious stimuli L > R  CV: rr, NSR, no murmur, R femoral site wnl, +distal pulses PULM:  PSV 5/5 with TV 550s, clear, occasional exp wheeze, few bibasilar rhonchi, minimal white sputum, more copious oral tan secretions, good cough GI: soft, bs+, NT, purwick- full cyu> now draining Extremities: warm/dry, no LE edema  Skin: no rashes  afebrile UOP> 15 however purwick full   Labs reviewed> K 4, sCr 1.09, iCa 1.19, LDL 130, WBC 11.7> 11.1, H/H 14.1/ 42.2> 11.4/ 34.3   Labs/imaging personally reviewed:  ET head 5/6 > hyperdense left M1/M2 MCA concerning for thrombus.  No hemorrhage, no evidence of acute large vascular territory infarct.  Aspects 10. CTA head/neck 5/6 > occluded left  ICA and left MCA, approximately 40% stenosis of the proximal right ICA, moderate stenosis of the right vertebral artery, moderate stenosis of the right brachiocephalic and left subclavian arteries.  Assessment & Plan:   L ICA and L MCAocclusion - s/p TNK  followed by L M2 thrombectomy, L ICA stent placement with angioplasty - Post procedure management per IR. - Stroke workup / management per neuro. - s/p heparin for 6 hrs, cont ASA, holding Brilinta till MRI imaging.  Will place cortrak today - MRI brain  scheduled for 9am.  Repeat CTH 24hrs post TNK (2000) - pending echo - serial neuro exams/ neuro protective measures - PT/ OT/ SLP when appropriate   Hx HTN, HLD. - cont cleviprex PRN for goal SBP 120 - 160 per neuro - prn labatelol/ hydralazine - Hold PTA Toprol-XL. - statin per neuro - pending echo  Respiratory insufficiency - due to inability to protect the airway in the setting of above. Hx COPD. - cont MV support, tolerating PSV but mental status remains barrier to extubation on low dose propofol.  Pending MRI imaging, hopeful to stop sedation, possible extubate later - PAD protocol> minimize propofol, prn fentanyl  - VAP/ PPI - triple therapy nebs - prn CXR/ ABG  - SLP post extubation   Hx DM. - A1c 6.2 - cont prn SSI  - Hold PTA Metformin (family states he takes this, but not on current MAR, pending med rec).   Hx Parkinsons Disease, PTSD. - cont PTA Sinemet - Hold PTA Gabapentin, Hydroxyzine, Ropinirole, Topiramate pending med rec.   Best practice (evaluated daily):  Diet/type: NPO; likely start EN soon, hold for possible extubation later today DVT prophylaxis:  start VTE 24hrs post TNK and pending further brain imaging Pressure ulcer(s): pressure ulcer assessment deferred  GI prophylaxis: PPI Lines: Arterial Line Foley:  Yes, and it is still needed Code Status:  full code Last date of multidisciplinary goals of care discussion: per primary team  Pending 5/7, no family at bedside   Labs   CBC: Recent Labs  Lab 06/27/23 1954 06/27/23 2000 06/27/23 2335 06/28/23 0028  WBC 11.7*  --   --  11.1*  NEUTROABS 9.8*  --   --   --   HGB 14.1 14.3 12.6* 11.4*  HCT 42.2 42.0 37.0* 34.3*  MCV 96.3  --   --  96.9  PLT 179  --   --  162    Basic Metabolic Panel: Recent Labs  Lab 06/27/23 1954 06/27/23 2000 06/27/23 2335 06/28/23 0028  NA 138 141 138 139  K 4.5 4.5 4.2 4.0  CL 105 104  --  106  CO2 24  --   --  23  GLUCOSE 139* 137*  --  144*  BUN 20  26*  --  18  CREATININE 1.40* 1.40*  --  1.09  CALCIUM 8.7*  --   --  7.5*  MG  --   --   --  1.7  PHOS  --   --   --  2.8   GFR: Estimated Creatinine Clearance: 57.7 mL/min (by C-G formula based on SCr of 1.09 mg/dL). Recent Labs  Lab 06/27/23 1954 06/28/23 0028  WBC 11.7* 11.1*    Liver Function Tests: Recent Labs  Lab 06/27/23 1954  AST 19  ALT 15  ALKPHOS 61  BILITOT 0.5  PROT 6.4*  ALBUMIN 3.4*   No results for input(s): "LIPASE", "AMYLASE" in the last 168 hours. No results for input(s): "AMMONIA" in  the last 168 hours.  ABG    Component Value Date/Time   PHART 7.344 (L) 06/27/2023 2335   PCO2ART 47.2 06/27/2023 2335   PO2ART 238 (H) 06/27/2023 2335   HCO3 25.7 06/27/2023 2335   TCO2 27 06/27/2023 2335   O2SAT 100 06/27/2023 2335     Coagulation Profile: Recent Labs  Lab 06/27/23 1954  INR 1.0    Cardiac Enzymes: No results for input(s): "CKTOTAL", "CKMB", "CKMBINDEX", "TROPONINI" in the last 168 hours.  HbA1C: Hgb A1c MFr Bld  Date/Time Value Ref Range Status  06/28/2023 12:28 AM 6.2 (H) 4.8 - 5.6 % Final    Comment:    (NOTE) Pre diabetes:          5.7%-6.4%  Diabetes:              >6.4%  Glycemic control for   <7.0% adults with diabetes     CBG: Recent Labs  Lab 06/28/23 0009 06/28/23 0340 06/28/23 0749  GLUCAP 166* 123* 128*    Allergies Allergies  Allergen Reactions   Indomethacin Other (See Comments)    Other reaction(s): Other (See Comments) Ear ringing      Home Medications  Prior to Admission medications   Medication Sig Start Date End Date Taking? Authorizing Provider  albuterol  (PROVENTIL ) (2.5 MG/3ML) 0.083% nebulizer solution Take 3 mLs (2.5 mg total) by nebulization every 6 (six) hours as needed for wheezing or shortness of breath. 07/21/21   Desai, Nikita S, MD  albuterol  (VENTOLIN  HFA) 108 (90 Base) MCG/ACT inhaler INHALE 2 PUFFS BY MOUTH FOUR TIMES A DAY AS NEEDED FOR WHEEZING, SHORTNESS OF BREATHE, AND COUGH  11/23/20   [provider]  Budeson-Glycopyrrol-Formoterol (BREZTRI  AEROSPHERE) 160-9-4.8 MCG/ACT AERO Inhale 2 puffs into the lungs in the morning and at bedtime. 10/07/22   Cobb, Mariah Shines, NP  carbidopa-levodopa (SINEMET IR) 10-100 MG tablet Take 1 tablet by mouth 3 (three) times daily.    [provider]  celecoxib (CELEBREX) 200 MG capsule Take 1 tablet by mouth daily. 01/12/10   [provider]  ezetimibe (ZETIA) 10 MG tablet TAKE ONE-HALF TABLET BY MOUTH DAILY FOR CHOLESTEROL CONTROL 11/23/20   [provider]  gabapentin (NEURONTIN) 600 MG tablet Take by mouth.    [provider]  hydrOXYzine (ATARAX) 10 MG tablet Take by mouth.    [provider]  metoprolol succinate (TOPROL-XL) 50 MG 24 hr tablet Take by mouth.    [provider]  omeprazole (PRILOSEC) 20 MG capsule Take by mouth.    [provider]  predniSONE  (DELTASONE ) 10 MG tablet 4 tabs for 2 days, then 3 tabs for 2 days, 2 tabs for 2 days, then 1 tab for 2 days, then stop 10/07/22   Cobb, Katherine V, NP  pyridoxine (B-6) 100 MG tablet Take by mouth.    [provider]  ranitidine (ZANTAC) 300 MG capsule Take by mouth.    [provider]  rOPINIRole (REQUIP) 0.25 MG tablet Take by mouth.    [provider]  topiramate (TOPAMAX) 100 MG tablet Take by mouth.    [provider]     Critical care time: 38 min      Early Glisson, MSN, AG-ACNP-BC [facility] 06/28/2023, 10:48 AM  See Amion for pager If no response to pager , please call [phone] until 7pm After 7:00 pm call Elink  [phone]

## 2023-06-28 NOTE — Telephone Encounter (Signed)
 Patient Product/process development scientist completed.    The patient is insured through [employer]. Patient has Medicare and is not eligible for a copay card, but may be able to apply for patient assistance or Medicare RX Payment Plan (Patient Must reach out to their plan, if eligible for payment plan), if available.    Ran test claim for Brilinta 90 mg and the current 30 day co-pay is $30.00.   This test claim was processed through [facility]- copay amounts may vary at other pharmacies due to pharmacy/plan contracts, or as the patient moves through the different stages of their insurance plan.     Morgan Arab, CPHT Pharmacy Technician III Certified Patient Advocate [facility] Patient Advocate Team Direct Number: [phone]  Fax: [phone]

## 2023-06-28 NOTE — Procedures (Signed)
 Cortrak  Person Inserting Tube:  Lekia Nier, Kerstin Peeling, RD Tube Type:  Cortrak - 43 inches Tube Size:  10 Tube Location:  Left nare Initial Placement:  Stomach Secured by: Bridle Technique Used to Measure Tube Placement:  Marking at nare/corner of mouth Cortrak Secured At:  78 cm  Cortrak Tube Team Note:  Consult received to place a Cortrak feeding tube.   No x-ray is required. RN may begin using tube.   If the tube becomes dislodged please keep the tube and contact the Cortrak team at [email or web address] for replacement.  If after hours and replacement cannot be delayed, place a NG tube and confirm placement with an abdominal x-ray.   Frederik Jansky, RD Registered Dietitian  See Amion for more information

## 2023-06-28 NOTE — Progress Notes (Signed)
 NIR Progress Note:   Postop Day #1 s/p emergent left ICA stent placement and M2 thrombectomy for acute stroke (NIHSS 24). TICI 3 revascularization.    Recommendations: Acute ischemic stroke with left ICA stent: Awaiting AM imaging to evaluate infarct size and possible hemorrhage. If there is no significant hemorrhage, consider dual anti-platelet therapy with aspirin 81 mg and Brilinta 90 mg daily.  May transition to plavix at 30 days if there are issues with coverage.  Follow-up arterial duplex US  at 1 month to evaluate stent patency and monitor RICA stenosis. Vomiting prior to procedure: Monitor for clinical signs of PNA. Routine postop stroke care and risk factor optimization. No activity restriction from femoral access standpoint. Appreciate excellent multidisciplinary care.  Please call with questions or concerns.   Overnight events: Completed stroke intervention at approx 10 pm overnight.  Some blood pressure fluctuations overnight.  Intubated and sedated during morning eval.   Subjective: Unable to obtain  Objective: Physical Exam: BP 112/69   Pulse 62   Temp (!) 96.1 F (35.6 C) (Axillary)   Resp 16   Ht 5' 9.5" (1.765 m)   Wt 82.8 kg   SpO2 95%   BMI 26.57 kg/m   Intubated and sedated No overt signs of respirator insufficiency Non-tachycardic Right femoral access site soft Dressing C/D/I Distal extremities well perfused   Current Facility-Administered Medications:     stroke: early stages of recovery book, , Does not apply, Once, Augustin Leber, MD   0.9 %  sodium chloride infusion, , Intravenous, Continuous, Augustin Leber, MD, Last Rate: 75 mL/hr at 06/28/23 0700, Infusion Verify at 06/28/23 0700   0.9 %  sodium chloride infusion, , Intravenous, Continuous, Kirkpatrick, McNeill P, MD   0.9 %  sodium chloride infusion, 250 mL, Intravenous, Continuous, Ogan, Okoronkwo U, MD   acetaminophen  (TYLENOL ) tablet 650 mg, 650 mg, Oral, Q4H PRN **OR**  acetaminophen  (TYLENOL ) 160 MG/5ML solution 650 mg, 650 mg, Per Tube, Q4H PRN **OR** acetaminophen  (TYLENOL ) suppository 650 mg, 650 mg, Rectal, Q4H PRN, Augustin Leber, MD   albuterol  (PROVENTIL ) (2.5 MG/3ML) 0.083% nebulizer solution 2.5 mg, 2.5 mg, Nebulization, Q3H PRN, Desai, Rahul P, PA-C   arformoterol (BROVANA) nebulizer solution 15 mcg, 15 mcg, Nebulization, BID, Desai, Rahul P, PA-C, 15 mcg at 06/27/23 2334   budesonide (PULMICORT) nebulizer solution 0.5 mg, 0.5 mg, Nebulization, BID, Desai, Rahul P, PA-C, 0.5 mg at 06/27/23 2335   carbidopa-levodopa (SINEMET IR) 10-100 MG per tablet immediate release 1.5 tablet, 1.5 tablet, Per Tube, TID, Dione Franks, Rahul P, PA-C, 1.5 tablet at 06/28/23 1610   Chlorhexidine Gluconate Cloth 2 % PADS 6 each, 6 each, Topical, Daily, Alecia Ames, Althea Atkinson, MD   clevidipine (CLEVIPREX) infusion 0.5 mg/mL, 0-21 mg/hr, Intravenous, Continuous, Reagan Camera, MD, Stopped at 06/28/23 [phone]   docusate (COLACE) 50 MG/5ML liquid 100 mg, 100 mg, Per Tube, BID, Desai, Rahul P, PA-C   fentaNYL (SUBLIMAZE) injection 25 mcg, 25 mcg, Intravenous, Q15 min PRN, Desai, Rahul P, PA-C   fentaNYL (SUBLIMAZE) injection 25-100 mcg, 25-100 mcg, Intravenous, Q30 min PRN, Desai, Rahul P, PA-C, 50 mcg at 06/27/23 2359   insulin aspart (novoLOG) injection 0-15 Units, 0-15 Units, Subcutaneous, Q4H, Desai, Rahul P, PA-C, 2 Units at 06/28/23 0400   Oral care mouth rinse, 15 mL, Mouth Rinse, Q2H, Augustin Leber, MD, 15 mL at 06/28/23 0553   Oral care mouth rinse, 15 mL, Mouth Rinse, PRN, Augustin Leber, MD   pantoprazole (PROTONIX) injection 40 mg, 40 mg,  Intravenous, QHS, Augustin Leber, MD, 40 mg at 06/28/23 0042   polyethylene glycol (MIRALAX / GLYCOLAX) packet 17 g, 17 g, Per Tube, Daily, Desai, Rahul P, PA-C   propofol (DIPRIVAN) 1000 MG/100ML infusion, 0-50 mcg/kg/min, Intravenous, Continuous, Desai, Rahul P, PA-C, Last Rate: 17.39 mL/hr at 06/28/23 0700, 35  mcg/kg/min at 06/28/23 0700   revefenacin (YUPELRI) nebulizer solution 175 mcg, 175 mcg, Nebulization, Daily, Desai, Rahul P, PA-C   sodium chloride 0.9 % bolus 250 mL, 250 mL, Intravenous, PRN, Reagan Camera, MD  Labs: CBC Recent Labs    06/27/23 1954 06/27/23 2000 06/27/23 2335 06/28/23 0028  WBC 11.7*  --   --  11.1*  HGB 14.1   < > 12.6* 11.4*  HCT 42.2   < > 37.0* 34.3*  PLT 179  --   --  162   < > = values in this interval not displayed.   BMET Recent Labs    06/27/23 1954 06/27/23 2000 06/27/23 2335 06/28/23 0028  NA 138 141 138 139  K 4.5 4.5 4.2 4.0  CL 105 104  --  106  CO2 24  --   --  23  GLUCOSE 139* 137*  --  144*  BUN 20 26*  --  18  CREATININE 1.40* 1.40*  --  1.09  CALCIUM 8.7*  --   --  7.5*   LFT Recent Labs    06/27/23 1954  PROT 6.4*  ALBUMIN 3.4*  AST 19  ALT 15  ALKPHOS 61  BILITOT 0.5   PT/INR Recent Labs    06/27/23 1954  LABPROT 13.1  INR 1.0    Studies/Results: MRI pending   LOS: 1 day   I spent a total of 20 minutes in face to face in clinical consultation, greater than 50% of which was counseling/coordinating care.  Elise Gladden  06/28/2023 8:07 AM

## 2023-06-28 NOTE — Progress Notes (Signed)
 Initial Nutrition Assessment  DOCUMENTATION CODES:   Non-severe (moderate) malnutrition in context of chronic illness  INTERVENTION:  ***   NUTRITION DIAGNOSIS:   Moderate Malnutrition related to chronic illness as evidenced by moderate fat depletion, moderate muscle depletion.  GOAL:   Patient will meet greater than or equal to 90% of their needs  MONITOR:   TF tolerance  REASON FOR ASSESSMENT:    (Cortrak tube)    ASSESSMENT:   Pt with PMH of COPD, HTN, Parkinson's, PTSD, and DM admitted with right-sided weakness and aphasia dx with L IC and MC occlusion s/p TNK and IR for stent.   Pt discussed during ICU rounds and with RN and MD.  Plan to wean sedation and extubate after cortrak placement.   5/6 - admit; intubated; s/p TNA and IR for L M2 thrombectomy, L ICA stent with angioplasty 5/7 - s/p cortrak placement  Medications reviewed and include: sinemet TID, colace BID, SSI every 4 hours, protonix, miralax NS @ 75 ml/hr 2 g mag sulfate x 1   Labs reviewed:  A1C 6.2 CBG's: 123-166    NUTRITION - FOCUSED PHYSICAL EXAM:  Flowsheet Row Most Recent Value  Orbital Region Moderate depletion  Upper Arm Region Moderate depletion  Thoracic and Lumbar Region Moderate depletion  Buccal Region Unable to assess  Temple Region Moderate depletion  Clavicle Bone Region Moderate depletion  Clavicle and Acromion Bone Region Moderate depletion  Scapular Bone Region Unable to assess  Dorsal Hand Moderate depletion  Patellar Region Moderate depletion  Anterior Thigh Region Moderate depletion  Posterior Calf Region Mild depletion  Edema (RD Assessment) None  Hair Reviewed  Eyes Unable to assess  Mouth Unable to assess  Skin Reviewed  Nails Reviewed       Diet Order:   Diet Order             Diet NPO time specified  Diet effective now                   EDUCATION NEEDS:   Not appropriate for education at this time  Skin:     Last BM:  unknown; abd  soft  Height:   Ht Readings from Last 1 Encounters:  06/27/23 5' 9.5" (1.765 m)    Weight:   Wt Readings from Last 1 Encounters:  06/27/23 82.8 kg    BMI:  Body mass index is 26.57 kg/m.  Estimated Nutritional Needs:   Kcal:     Protein:     Fluid:     Imagine Nest P., RD, LDN, CNSC See AMiON for contact information

## 2023-06-28 NOTE — Progress Notes (Signed)
 Pt tranpsorted on vent to CT and back to 4N27 without complications.

## 2023-06-28 NOTE — Progress Notes (Signed)
 Echocardiogram 2D Echocardiogram has been performed.  Rickey Baker RDCS 06/28/2023, 9:30 AM

## 2023-06-28 NOTE — Progress Notes (Signed)
Fentanyl given.

## 2023-06-28 NOTE — Progress Notes (Signed)
 eLink Physician-Brief Progress Note Patient Name: Rickey Baker DOB: 1945/12/18 MRN: [id number]   Date of Service  06/28/2023  HPI/Events of Note  Patient with sub-optimal SBP (outside neurology recommended parameters) despite discontinuation of Cleviprex and weaning of Propofol.  eICU Interventions  Normal Saline 500 ml iv bolus ordered over one hour.        Oneika Simonian U Rowene Suto 06/28/2023, 1:26 AM

## 2023-06-28 NOTE — Progress Notes (Addendum)
 Elink MD made aware of sub-optimal BP, despite bolus given

## 2023-06-28 NOTE — Progress Notes (Signed)
 CCM MD ordered 500cc bolus

## 2023-06-28 NOTE — Progress Notes (Signed)
 Spoke with MD Alecia Ames about BP, order for 1 liter bolus

## 2023-06-28 NOTE — Progress Notes (Signed)
 OT Cancellation Note  Patient Details Name: Rickey Baker MRN: [id number] DOB: June 10, 1945   Cancelled Treatment:    Reason Eval/Treat Not Completed: Active bedrest order  Neomia Banner 06/28/2023, 11:10 AM

## 2023-06-29 ENCOUNTER — Inpatient Hospital Stay (HOSPITAL_COMMUNITY)

## 2023-06-29 DIAGNOSIS — I6522 Occlusion and stenosis of left carotid artery: Secondary | ICD-10-CM | POA: Diagnosis not present

## 2023-06-29 DIAGNOSIS — I10 Essential (primary) hypertension: Secondary | ICD-10-CM | POA: Diagnosis not present

## 2023-06-29 DIAGNOSIS — R0689 Other abnormalities of breathing: Secondary | ICD-10-CM | POA: Diagnosis not present

## 2023-06-29 DIAGNOSIS — E440 Moderate protein-calorie malnutrition: Secondary | ICD-10-CM | POA: Insufficient documentation

## 2023-06-29 DIAGNOSIS — I69391 Dysphagia following cerebral infarction: Secondary | ICD-10-CM | POA: Diagnosis not present

## 2023-06-29 DIAGNOSIS — E119 Type 2 diabetes mellitus without complications: Secondary | ICD-10-CM | POA: Diagnosis not present

## 2023-06-29 DIAGNOSIS — I63512 Cerebral infarction due to unspecified occlusion or stenosis of left middle cerebral artery: Secondary | ICD-10-CM | POA: Diagnosis not present

## 2023-06-29 DIAGNOSIS — J9589 Other postprocedural complications and disorders of respiratory system, not elsewhere classified: Secondary | ICD-10-CM | POA: Diagnosis not present

## 2023-06-29 LAB — GLUCOSE, CAPILLARY
Glucose-Capillary: 148 mg/dL — ABNORMAL HIGH (ref 70–99)
Glucose-Capillary: 150 mg/dL — ABNORMAL HIGH (ref 70–99)
Glucose-Capillary: 153 mg/dL — ABNORMAL HIGH (ref 70–99)
Glucose-Capillary: 168 mg/dL — ABNORMAL HIGH (ref 70–99)
Glucose-Capillary: 176 mg/dL — ABNORMAL HIGH (ref 70–99)
Glucose-Capillary: 186 mg/dL — ABNORMAL HIGH (ref 70–99)

## 2023-06-29 LAB — CBC
HCT: 38.4 % — ABNORMAL LOW (ref 39.0–52.0)
Hemoglobin: 12.8 g/dL — ABNORMAL LOW (ref 13.0–17.0)
MCH: 32.2 pg (ref 26.0–34.0)
MCHC: 33.3 g/dL (ref 30.0–36.0)
MCV: 96.5 fL (ref 80.0–100.0)
Platelets: 189 10*3/uL (ref 150–400)
RBC: 3.98 MIL/uL — ABNORMAL LOW (ref 4.22–5.81)
RDW: 12.8 % (ref 11.5–15.5)
WBC: 15 10*3/uL — ABNORMAL HIGH (ref 4.0–10.5)
nRBC: 0 % (ref 0.0–0.2)

## 2023-06-29 LAB — MAGNESIUM: Magnesium: 2 mg/dL (ref 1.7–2.4)

## 2023-06-29 LAB — BASIC METABOLIC PANEL WITH GFR
Anion gap: 8 (ref 5–15)
BUN: 12 mg/dL (ref 8–23)
CO2: 21 mmol/L — ABNORMAL LOW (ref 22–32)
Calcium: 8.2 mg/dL — ABNORMAL LOW (ref 8.9–10.3)
Chloride: 110 mmol/L (ref 98–111)
Creatinine, Ser: 0.93 mg/dL (ref 0.61–1.24)
GFR, Estimated: >60 mL/min (ref >60)
Glucose, Bld: 139 mg/dL — ABNORMAL HIGH (ref 70–99)
Potassium: 4 mmol/L (ref 3.5–5.1)
Sodium: 139 mmol/L (ref 135–145)

## 2023-06-29 LAB — PHOSPHORUS: Phosphorus: 2.4 mg/dL — ABNORMAL LOW (ref 2.5–4.6)

## 2023-06-29 MED ORDER — FUROSEMIDE 10 MG/ML IJ SOLN
20.0000 mg | Freq: Once | INTRAMUSCULAR | Status: AC
  Administered 2023-06-29: 20 mg via INTRAVENOUS
  Filled 2023-06-29: qty 2

## 2023-06-29 MED ORDER — PROSOURCE TF20 ENFIT COMPATIBL EN LIQD
60.0000 mL | Freq: Three times a day (TID) | ENTERAL | Status: DC
  Administered 2023-06-29 – 2023-07-08 (×26): 60 mL
  Filled 2023-06-29 (×27): qty 60

## 2023-06-29 MED ORDER — OSMOLITE 1.5 CAL PO LIQD
1000.0000 mL | ORAL | Status: DC
  Administered 2023-06-29 – 2023-07-07 (×7): 1000 mL
  Filled 2023-06-29: qty 1000

## 2023-06-29 MED ORDER — ADULT MULTIVITAMIN W/MINERALS CH
1.0000 | ORAL_TABLET | Freq: Every day | ORAL | Status: DC
  Administered 2023-06-29 – 2023-07-08 (×10): 1
  Filled 2023-06-29 (×10): qty 1

## 2023-06-29 MED ORDER — POTASSIUM PHOSPHATES 15 MMOLE/5ML IV SOLN
15.0000 mmol | Freq: Once | INTRAVENOUS | Status: AC
  Administered 2023-06-29: 15 mmol via INTRAVENOUS
  Filled 2023-06-29: qty 5

## 2023-06-29 MED ORDER — ENOXAPARIN SODIUM 40 MG/0.4ML IJ SOSY
40.0000 mg | PREFILLED_SYRINGE | Freq: Every day | INTRAMUSCULAR | Status: DC
  Administered 2023-06-29 – 2023-07-08 (×10): 40 mg via SUBCUTANEOUS
  Filled 2023-06-29 (×10): qty 0.4

## 2023-06-29 MED ORDER — METHYLPREDNISOLONE SODIUM SUCC 40 MG IJ SOLR
40.0000 mg | Freq: Every day | INTRAMUSCULAR | Status: DC
  Administered 2023-06-29 – 2023-07-02 (×4): 40 mg via INTRAVENOUS
  Filled 2023-06-29 (×4): qty 1

## 2023-06-29 MED ORDER — THIAMINE MONONITRATE 100 MG PO TABS
100.0000 mg | ORAL_TABLET | Freq: Every day | ORAL | Status: AC
  Administered 2023-06-29 – 2023-07-05 (×7): 100 mg
  Filled 2023-06-29 (×7): qty 1

## 2023-06-29 MED ORDER — SODIUM CHLORIDE 0.9 % IV SOLN
2.0000 g | INTRAVENOUS | Status: DC
  Administered 2023-06-29 – 2023-07-04 (×6): 2 g via INTRAVENOUS
  Filled 2023-06-29 (×6): qty 20

## 2023-06-29 NOTE — Evaluation (Signed)
 Occupational Therapy Evaluation Patient Details Name: Rickey Baker MRN: [id number] DOB: 07/29/1945 Today's Date: 06/29/2023   History of Present Illness   Pt is a 78 y/o male presenting on 5/6 with R sided weakness and aphasia. 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 Received TNK and s/p left M2 thrombectomy, L ICA stent placement with angioplasty. Extubated 5/7. PMH includes: arthritis, COPD, HTN, parkinson's disease, PTSD.     Clinical Impressions Pt admitted for above and presents with problem list below. Pt with no verbalization attempts during session, but following some gesture and verbal commands with increased time with L side.  He requires max-total assist +2 for bed mobility, max-total assist +2 for ADLs and min to max assist for sitting at EOB with R lateral and posterior lean.  Minimal flicker of movement in RUE, remains inconsistent; able to use L UE to wash face with support to initiate. He is scanning towards R side with increased time, preference to L gaze.  Unclear PLOF, but per chart live with spouse.  Based on performance today, believe pt will best benefit from continued OT services acutely and after dc at an inpatient setting with >3hrs/day to optimize independence, safety and return to PLOF with ADLs and mobility.      If plan is discharge home, recommend the following:   Two people to help with walking and/or transfers;Two people to help with bathing/dressing/bathroom;Supervision due to cognitive status;Direct supervision/assist for financial management;Direct supervision/assist for medications management     Functional Status Assessment   Patient has had a recent decline in their functional status and demonstrates the ability to make significant improvements in function in a reasonable and predictable amount of time.     Equipment Recommendations   Other (comment) (defer)     Recommendations for Other Services   Rehab consult      Precautions/Restrictions   Precautions Precautions: Fall Recall of Precautions/Restrictions: Impaired Precaution/Restrictions Comments: cortrak Restrictions Weight Bearing Restrictions Per Provider Order: No     Mobility Bed Mobility Overal bed mobility: Needs Assistance Bed Mobility: Supine to Sit, Sit to Supine     Supine to sit: Max assist, +2 for physical assistance Sit to supine: Total assist, +2 for physical assistance   General bed mobility comments: requires assist for intation and sequencing, able to assist minimally with L side    Transfers Overall transfer level: Needs assistance Equipment used: 2 person hand held assist Transfers: Sit to/from Stand Sit to Stand: Max assist, +2 physical assistance                  Balance Overall balance assessment: Needs assistance Sitting-balance support: No upper extremity supported, Feet supported, Single extremity supported Sitting balance-Leahy Scale: Poor Sitting balance - Comments: min at best, max mostly with R lateral and posterior lean Postural control: Posterior lean, Right lateral lean Standing balance support: Bilateral upper extremity supported, During functional activity Standing balance-Leahy Scale: Zero                             ADL either performed or assessed with clinical judgement   ADL Overall ADL's : Needs assistance/impaired     Grooming: Moderate assistance;Sitting (chair position) Grooming Details (indicate cue type and reason): L hand to wipe eyes, assist to initate but able to complete task after Upper Body Bathing: Maximal assistance;Sitting   Lower Body Bathing: Total assistance;Bed level   Upper Body Dressing : Sitting;Total assistance   Lower Body  Dressing: Total assistance;+2 for physical assistance;Bed level     Toilet Transfer Details (indicate cue type and reason): deferred         Functional mobility during ADLs: Maximal assistance;+2 for physical  assistance       Vision   Additional Comments: difficult to assess, L gaze preference but able to scan towards R side and locate therapist with increased time.  Further assessment required.     Perception         Praxis         Pertinent Vitals/Pain Pain Assessment Pain Assessment: Faces Faces Pain Scale: No hurt Pain Intervention(s): Monitored during session     Extremity/Trunk Assessment Upper Extremity Assessment Upper Extremity Assessment: RUE deficits/detail;Generalized weakness RUE Deficits / Details: difficult to assess due to impaired commuincation but noted flicker of movement in hand and attempts to raise UE minimally.  grossly flaccid with no functional use. no response to noxious stimuli. RUE Sensation: decreased light touch;decreased proprioception RUE Coordination: decreased fine motor;decreased gross motor   Lower Extremity Assessment Lower Extremity Assessment: Defer to PT evaluation       Communication Communication Communication: Impaired Factors Affecting Communication: Difficulty expressing self (no attempted verbalizations)   Cognition Arousal: Alert Behavior During Therapy: Flat affect Cognition: Difficult to assess Difficult to assess due to: Impaired communication           OT - Cognition Comments: pt able to follow some gestural and less verbal commands, poor intation and sequencing.  no verbalizations during session.                 Following commands: Impaired Following commands impaired: Follows one step commands inconsistently, Follows one step commands with increased time     Cueing  General Comments   Cueing Techniques: Verbal cues;Gestural cues;Visual cues;Tactile cues  VSS on 5L [place] during session. BP 132/81 HR 98 SpO2 93-96%   Exercises     Shoulder Instructions      Home Living Family/patient expects to be discharged to:: Private residence Living Arrangements: Spouse/significant other                                Additional Comments: per chart home with spouse but pt unable to provide home setup      Prior Functioning/Environment Prior Level of Function : Independent/Modified Independent;Patient poor historian/Family not available               ADLs Comments: per chart independent but pt unable to provide PLOF and family not present    OT Problem List: Decreased strength;Decreased range of motion;Decreased activity tolerance;Impaired balance (sitting and/or standing);Impaired vision/perception;Decreased coordination;Decreased cognition;Decreased safety awareness;Decreased knowledge of use of DME or AE;Decreased knowledge of precautions;Cardiopulmonary status limiting activity;Impaired sensation;Impaired tone;Impaired UE functional use   OT Treatment/Interventions: Self-care/ADL training;Neuromuscular education;DME and/or AE instruction;Therapeutic activities;Cognitive remediation/compensation;Splinting;Manual therapy;Visual/perceptual remediation/compensation;Patient/family education;Balance training      OT Goals(Current goals can be found in the care plan section)   Acute Rehab OT Goals Patient Stated Goal: unable OT Goal Formulation: Patient unable to participate in goal setting Time For Goal Achievement: 07/13/23 Potential to Achieve Goals: Fair   OT Frequency:  Min 2X/week    Co-evaluation PT/OT/SLP Co-Evaluation/Treatment: Yes Reason for Co-Treatment: For patient/therapist safety;To address functional/ADL transfers;Necessary to address cognition/behavior during functional activity;Complexity of the patient's impairments (multi-system involvement) PT goals addressed during session: Mobility/safety with mobility OT goals addressed during session: ADL's and self-care  AM-PAC OT "6 Clicks" Daily Activity     Outcome Measure Help from another person eating meals?: Total Help from another person taking care of personal grooming?: A Lot Help from another  person toileting, which includes using toliet, bedpan, or urinal?: Total Help from another person bathing (including washing, rinsing, drying)?: A Lot Help from another person to put on and taking off regular upper body clothing?: Total Help from another person to put on and taking off regular lower body clothing?: Total 6 Click Score: 8   End of Session Equipment Utilized During Treatment: Oxygen (5L) Nurse Communication: Mobility status  Activity Tolerance: Patient tolerated treatment well Patient left: in bed;with call bell/phone within reach;with bed alarm set  OT Visit Diagnosis: Other abnormalities of gait and mobility (R26.89);Muscle weakness (generalized) (M62.81);Hemiplegia and hemiparesis;Other symptoms and signs involving cognitive function;Other symptoms and signs involving the nervous system (R29.898);Cognitive communication deficit (R41.841) Symptoms and signs involving cognitive functions: Cerebral infarction Hemiplegia - Right/Left: Right Hemiplegia - caused by: Cerebral infarction                Time: 0902-0930 OT Time Calculation (min): 28 min Charges:  OT General Charges $OT Visit: 1 Visit OT Evaluation $OT Eval Moderate Complexity: 1 Mod  Bary Boss, OT Acute Rehabilitation Services Office [phone] Secure Chat Preferred    Fredrich Jefferson 06/29/2023, 11:09 AM

## 2023-06-29 NOTE — Progress Notes (Addendum)
 STROKE TEAM PROGRESS NOTE   SIGNIFICANT EVENTS 5/6 - TNK and mechanical thrombectomy  5/7- Extubated- lots of secretions  INTERIM HISTORY/SUBJECTIVE He presented with aphasia right hemiplegia due to left carotid and MCA occlusion and underwent treatment with TNK followed by successful mechanical thrombectomy but requiring rescue left ICA stenting.  ASA and brilinta continued.  Remains aphasic with right hemiplegia.  Extubated 5/7, plan to keep in the ICU at least 1 more day due to secretions and weak cough/gag. Working with PT/OT today Vital signs stable.  Blood pressure adequately controlled.  He has a panda tube for feeding OBJECTIVE  CBC    Component Value Date/Time   WBC 15.0 (H) 06/29/2023 0607   RBC 3.98 (L) 06/29/2023 0607   HGB 12.8 (L) 06/29/2023 0607   HCT 38.4 (L) 06/29/2023 0607   PLT 189 06/29/2023 0607   MCV 96.5 06/29/2023 0607   MCH 32.2 06/29/2023 0607   MCHC 33.3 06/29/2023 0607   RDW 12.8 06/29/2023 0607   LYMPHSABS 0.7 06/27/2023 1954   MONOABS 0.8 06/27/2023 1954   EOSABS 0.2 06/27/2023 1954   BASOSABS 0.1 06/27/2023 1954    BMET    Component Value Date/Time   NA 139 06/29/2023 0607   K 4.0 06/29/2023 0607   CL 110 06/29/2023 0607   CO2 21 (L) 06/29/2023 0607   GLUCOSE 139 (H) 06/29/2023 0607   BUN 12 06/29/2023 0607   CREATININE 0.93 06/29/2023 0607   CALCIUM 8.2 (L) 06/29/2023 0607   GFRNONAA >60 06/29/2023 4098    IMAGING past 24 hours CT HEAD WO CONTRAST Result Date: 06/28/2023 CLINICAL DATA:  Stroke, follow up EXAM: CT HEAD WITHOUT CONTRAST TECHNIQUE: Contiguous axial images were obtained from the base of the skull through the vertex without intravenous contrast. RADIATION DOSE REDUCTION: This exam was performed according to the departmental dose-optimization program which includes automated exposure control, adjustment of the mA and/or kV according to patient size and/or use of iterative reconstruction technique. COMPARISON:  MRI from earlier  today. FINDINGS: Brain: Acute left MCA territory infarcts, similar when comparing across modalities. No evidence of mass occupying acute hemorrhage or progressive mass effect. Additional patchy white matter hypodensities are compatible with chronic microvascular disease. No mass lesion or hydrocephalus. Vascular: Further evaluated on recent CTA. Skull: No acute fracture. Sinuses/Orbits: Mild paranasal sinus mucosal thickening with small amount of frothy secretions in the right sphenoid sinus. No acute orbital findings. Other: No mastoid effusions. IMPRESSION: Acute left MCA territory infarcts, similar when comparing across modalities. No evidence of mass occupying acute hemorrhage or progressive mass effect. Electronically Signed   By: Stevenson Elbe M.D.   On: 06/28/2023 21:10   ECHOCARDIOGRAM COMPLETE Result Date: 06/28/2023    ECHOCARDIOGRAM REPORT   Patient Name:   MACARIUS RUSZKOWSKI Date of Exam: 06/28/2023 Medical Rec #:  [id number]      Height:       69.5 in Accession #:    [id number]     Weight:       182.5 lb Date of Birth:  [birth year]      BSA:          1.997 m Patient Age:    77 years       BP:           135/61 mmHg Patient Gender: M              HR:           68 bpm. Exam Location:  Inpatient Procedure: 2D  Echo, Color Doppler, Cardiac Doppler and Intracardiac            Opacification Agent (Both Spectral and Color Flow Doppler were            utilized during procedure). Indications:    Stroke i63.9  History:        Patient has no prior history of Echocardiogram examinations.                 COPD; Risk Factors:Hypertension and Dyslipidemia.  Sonographer:    Sherline Distel Senior RDCS Sonographer#2:  Janette Medley RDCS Referring Phys: [phone] MCNEILL P KIRKPATRICK  Sonographer Comments: Very difficult windows due to COPD, scanned supine on artificial respirator IMPRESSIONS  1. Left ventricular ejection fraction, by estimation, is 35 to 40%. The left ventricle has moderately decreased function. The left ventricle has no  regional wall motion abnormalities. There is mild left ventricular hypertrophy. Left ventricular diastolic parameters are consistent with Grade I diastolic dysfunction (impaired relaxation).  2. Right ventricular systolic function is normal. The right ventricular size is normal.  3. The mitral valve is normal in structure. No evidence of mitral valve regurgitation. No evidence of mitral stenosis.  4. The aortic valve is calcified. There is mild calcification of the aortic valve. There is mild thickening of the aortic valve. Aortic valve regurgitation is not visualized. Aortic valve sclerosis is present, with no evidence of aortic valve stenosis.  5. The inferior vena cava is dilated in size with >50% respiratory variability, suggesting right atrial pressure of 8 mmHg. Conclusion(s)/Recommendation(s): No intracardiac source of embolism detected on this transthoracic study. Consider a transesophageal echocardiogram to exclude cardiac source of embolism if clinically indicated. FINDINGS  Left Ventricle: Left ventricular ejection fraction, by estimation, is 35 to 40%. The left ventricle has moderately decreased function. The left ventricle has no regional wall motion abnormalities. Definity contrast agent was given IV to delineate the left ventricular endocardial borders. The left ventricular internal cavity size was normal in size. There is mild left ventricular hypertrophy. Left ventricular diastolic parameters are consistent with Grade I diastolic dysfunction (impaired relaxation). Right Ventricle: The right ventricular size is normal. No increase in right ventricular wall thickness. Right ventricular systolic function is normal. Left Atrium: Left atrial size was normal in size. Right Atrium: Right atrial size was normal in size. Pericardium: There is no evidence of pericardial effusion. Mitral Valve: The mitral valve is normal in structure. No evidence of mitral valve regurgitation. No evidence of mitral valve  stenosis. Tricuspid Valve: The tricuspid valve is normal in structure. Tricuspid valve regurgitation is not demonstrated. No evidence of tricuspid stenosis. Aortic Valve: The aortic valve is calcified. There is mild calcification of the aortic valve. There is mild thickening of the aortic valve. Aortic valve regurgitation is not visualized. Aortic valve sclerosis is present, with no evidence of aortic valve stenosis. Pulmonic Valve: The pulmonic valve was normal in structure. Pulmonic valve regurgitation is not visualized. No evidence of pulmonic stenosis. Aorta: The aortic root is normal in size and structure. Venous: The inferior vena cava is dilated in size with greater than 50% respiratory variability, suggesting right atrial pressure of 8 mmHg. IAS/Shunts: No atrial level shunt detected by color flow Doppler.  LEFT VENTRICLE PLAX 2D LVIDd:         5.45 cm   Diastology LVIDs:         4.00 cm   LV e' medial:    2.64 cm/s LV PW:  1.15 cm   LV E/e' medial:  15.8 LV IVS:        1.20 cm   LV e' lateral:   2.49 cm/s LVOT diam:     2.00 cm   LV E/e' lateral: 16.7 LV SV:         56 LV SV Index:   28 LVOT Area:     3.14 cm  RIGHT VENTRICLE            IVC RV S prime:     9.17 cm/s  IVC diam: 2.10 cm TAPSE (M-mode): 2.7 cm LEFT ATRIUM           Index        RIGHT ATRIUM           Index LA Vol (A2C): 49.7 ml 24.88 ml/m  RA Area:     16.00 cm LA Vol (A4C): 26.7 ml 13.37 ml/m  RA Volume:   46.10 ml  23.08 ml/m  AORTIC VALVE LVOT Vmax:   94.20 cm/s LVOT Vmean:  67.200 cm/s LVOT VTI:    0.177 m  AORTA Ao Root diam: 3.70 cm MITRAL VALVE MV Area (PHT): 6.96 cm    SHUNTS MV Decel Time: 109 msec    Systemic VTI:  0.18 m MV E velocity: 41.60 cm/s  Systemic Diam: 2.00 cm MV A velocity: 94.90 cm/s MV E/A ratio:  0.44 Dorothye Gathers MD Electronically signed by Dorothye Gathers MD Signature Date/Time: 06/28/2023/1:41:16 PM    Final    MR BRAIN WO CONTRAST Result Date: 06/28/2023 CLINICAL DATA:  Provided history: Stroke, follow-up.  EXAM: MRI HEAD WITHOUT CONTRAST TECHNIQUE: Multiplanar, multiecho pulse sequences of the brain and surrounding structures were obtained without intravenous contrast. COMPARISON:  Non-contrast head CT and CT angiogram head/neck 06/27/2023. FINDINGS: Brain: No age-advanced or lobar predominant cerebral atrophy. Acute infarct affecting much of the left caudate and lentiform nuclei. Patchy acute cortically-based infarcts scattered within the frontal, parietal and temporal lobes on the left (MCA and ACA vascular territories). Additional small acute infarct along the atrium of the left lateral ventricle. Punctate acute cortical infarct within the perirolandic medial right frontoparietal lobes (series 3, image 43). Chronic lacunar infarct within the right corona radiata/basal ganglia. Background moderate multifocal T2 FLAIR hyperintense signal abnormality within the cerebral white matter and pons, nonspecific but compatible chronic small vessel ischemic disease. No evidence of an intracranial mass. No extra-axial fluid collection. No midline shift. Vascular: Maintained flow voids within the proximal large arterial vessels. Skull and upper cervical spine: No focal worrisome marrow lesion. Sinuses/Orbits: No mass or acute finding within the imaged orbits. Minimal mucosal thickening within the bilateral frontal sinuses. Mild bilateral ethmoid sinusitis. Small fluid levels within the bilateral sphenoid sinuses. Mucosal thickening within the bilateral maxillary sinuses (mild right, trace left). Impressions #1, #2 and #3 will be called to the ordering clinician or representative by the Radiologist Assistant, and communication documented in the PACS or [employer]. IMPRESSION: 1. Acute infarct affecting much of the left caudate and lentiform nuclei. 2. Patchy acute cortically-based infarcts scattered within the frontal, parietal and temporal lobes on the left (MCA and ACA vascular territories). Additional small acute infarct  along the atrium of the left lateral ventricle. 3. Punctate acute cortical infarct within the perirolandic medial right frontoparietal lobes. 4. Chronic lacunar infarct within the right corona radiata/basal ganglia. 5. Background moderate chronic small vessel ischemic changes within the cerebral white matter and pons. 6. Paranasal sinus disease as described. Electronically Signed   By:  Bascom Lily D.O.   On: 06/28/2023 11:12    Vitals:   06/29/23 0500 06/29/23 0600 06/29/23 0700 06/29/23 0800  BP: (!) 183/89 130/65 (!) 140/77 134/68  Pulse: (!) 103 (!) 103 (!) 104 96  Resp: (!) 26 (!) 26 (!) 29 (!) 24  Temp:    98.8 F (37.1 C)  TempSrc:    Axillary  SpO2: 93% 93% 94% 94%  Weight:      Height:         PHYSICAL EXAM General:  Alert, well-nourished, well-developed patient in no acute distress Psych:  Mood and affect appropriate for situation CV: Regular rate and rhythm on monitor Respiratory:  Regular, unlabored respirations on room air GI: Abdomen soft and nontender   NEURO:  Mental Status: Awake, eyes open  Speech/Language: Mimics, no verbal output   Cranial Nerves:  II: Pupils equal, sluggish III, IV, VI: eyes midline VIII: non response to verbal stimuli  IX, X: Cough and gag intact EA:VWUJ midline XII: tongue is midline without fasciculations. Motor: no purposeful movement noted due to sedation Tone: is normal and bulk is normal Sensation- withdraws to pain bilaterally in lower extremities  Coordination: not tested Gait- deferred   ASSESSMENT/PLAN  Mr. Zacheria Tooke is a 78 y.o. male with history of hypertension, hyperlipidemia, Parkinson's disease who presents with right-sided weakness and aphasia .  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 Left M2 occlusion with successful suction  thrombectomy   Code Stroke CT head Hyperdense left M1/M2 MCA concerning for thrombus. ASPECTS 10.   CTA head & neck  Occluded left ICA and left MCA, as detailed above. Approximately 40% stenosis of the proximal right ICA. MRI   Acute infarct affecting much of the left caudate and lentiform nuclei. Patchy acute cortically-based infarcts scattered within the frontal, parietal and temporal lobes on the left (MCA and ACA vascular territories). Additional small acute infarct along the atrium of the left lateral ventricle. Punctate acute cortical infarct within the perirolandic medial right frontoparietal lobes.  2D Echo EF 35-40%  LDL 130 HgbA1c 6.2 VTE prophylaxis - SCDs No antithrombotic prior to admission, now on aspirin 81 mg daily and Brilinta (ticagrelor) 90 mg bid. Therapy recommendations:  No follow up needed  Disposition:  PRN  Respiratory insufficiency PCCM consulted afor airway management Extubated 5/7 Nebs, NT suction One time dose of lasix   Hypertension Blood Pressure Goal: SBP 120-160 for first 24 hours then less than 180  IV cleviprex if needed PRN hydralazine and labetalol   Hyperlipidemia Home meds:  Zetia, resumed in hospital LDL 130, goal < 70 Add atorvastatin 80mg  Continue statin at discharge  Diabetes type II Controlled HgbA1c 6.2, goal < 7.0 CBGs SSI Recommend close follow-up with PCP for better DM control  Dysphagia Patient has post-stroke dysphagia, SLP consulted    Diet   Diet NPO time specified   Advance diet as tolerated  Other Stroke Risk Factors Obesity, Body mass index is 26.57 kg/m., BMI >/= 30 associated with increased stroke risk, recommend weight loss, diet and exercise as appropriate   Other Active Problems Parkinson's disease Sinemet resumed Gabapentin, Hydroxyzine, Ropinirole, Topiramate - on hold until med rec is done  Hospital day # 2  Patient seen and examined by NP/APP with MD. MD to update note as needed.   Imogene Mana,  DNP,  FNP-BC [facility] Pager: [phone]) [phone]   I have personally obtained history,examined this patient, reviewed notes, independently viewed imaging studies, participated in medical decision making and plan of care.ROS completed by me personally and pertinent positives fully documented  I have made any additions or clarifications directly to the above note. Agree with note above.  Patient remains aphasic with dense right hemiplegia.  Follows simple midline and few commands to mimicking.  Continue tube feeds via panda tube and medications.  Mobilize out of bed.  Therapy consults.  Transfer out of ICU to neurology floor bed today.  No family available at bedside for discussion.  Greater than 50% time during this 50-minute visit was spent in counseling and coordination of care about the stroke and discussion with patient and care team and answering questions.  Ardella Beaver, MD Medical Director [facility] Pager: [phone] 06/29/2023 4:12 PM    To contact Stroke Continuity provider, please refer to [email or web address]. After hours, contact General Neurology

## 2023-06-29 NOTE — Progress Notes (Signed)
 SLP Cancellation Note  Patient Details Name: Rickey Baker MRN: [id number] DOB: Jul 05, 1945   Cancelled treatment:       Reason Eval/Treat Not Completed: Patient not medically ready. Pt just extubated this am. RN says hold until tomorrow.    Mieczyslaw Stamas, [name] 06/29/2023, 10:25 AM

## 2023-06-29 NOTE — TOC CM/SW Note (Signed)
 Transition of Care [facility]) - Inpatient Brief Assessment   Patient Details  Name: Rickey Baker MRN: [id number] Date of Birth: 1945-08-27  Transition of Care [facility]) CM/SW Contact:    Jazper Nikolai M, RN Phone Number: 06/29/2023, 5:03 PM   Clinical Narrative: Pt is a 78 y/o male presenting on 5/6 with R sided weakness and aphasia. 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 Received TNK and s/p left M2 thrombectomy, L ICA stent placement with angioplasty. Extubated 5/7.  Patient has [employer], and [place] was notified upon admission by Admitting Department.  PT/OT recommending CIR and consult requested.   Transition of Care Asessment: Insurance and Status: Insurance coverage has been reviewed Patient has primary care [facility]: [facility] ([facility]) Home environment has been reviewed: Independent PTA Prior level of function:: Lives with spouse Prior/Current Home Services: No current home services Social Drivers of Health Review: SDOH reviewed needs interventions Readmission risk has been reviewed: Yes Transition of care needs: transition of care needs identified, TOC will continue to follow  Calla Catchings, RN, BSN  Trauma/Neuro ICU Case Manager [phone]

## 2023-06-29 NOTE — Progress Notes (Signed)
 Inpatient Rehab Admissions Coordinator Note:   Per therapy recommendations patient was screened for CIR candidacy by Mickey Alar, PT. At this time, pt appears to be a potential candidate for CIR. I will place an order for rehab consult for full assessment, per our protocol.  Please contact me any with questions.Loye Rumble, PT, DPT [phone] 06/29/23 1:01 PM

## 2023-06-29 NOTE — Progress Notes (Addendum)
 NAME:  Rickey Baker, MRN:  [id number], DOB:  Nov 12, 1945, LOS: 2 ADMISSION DATE:  06/27/2023, CONSULTATION DATE:  06/27/23 REFERRING MD:  Alecia Ames CHIEF COMPLAINT:  Vent Management   History of Present Illness:  Pt is encephelopathic; therefore, this HPI is obtained from chart review. Rickey Baker is a 78 y.o. male who has a PMH as below clued but not limited to hypertension, hyperlipidemia, Parkinson disease.  He presented to [facility] ED on 06/27/2023 with right-sided weakness and aphasia.  He was in his normal state of health earlier in the evening when his wife helped him lie down for a nap around 5 PM.  When she woke up later, she found that he was slumped to the side and unable to talk.  She called 911 and he was brought to the ED as a code stroke.  CT revealed no hemorrhage, CTA confirmed a left ICA/MCA occlusion.  He did receive TNKase at 2004.  He was then taken to neuro IR where he had a left M2 thrombectomy, L ICA stent placement with angioplasty performed.  Just prior to the procedure he apparently had a vomiting episode requiring suctioning therefore, following the procedure, he remained intubated and was transferred to the ICU PCCM was subsequently called in consultation for vent management. Per CRNA, he was awake during vomiting episode and had a strong cough during suctioning and there was NOT any concern for aspiration event.  Pertinent  Medical History:  has Acute hypoxic respiratory failure (HCC); COPD with acute exacerbation (HCC); Stroke (cerebrum) (HCC); and Hypertension associated with diabetes (HCC) on their problem list.  Significant Hospital Events: Including procedures, antibiotic start and stop dates in addition to other pertinent events   5/6 admit, to [facility] for left M2 thrombectomy, L ICA stent placement with angioplasty.  MRI s  Interim History / Subjective:  CTH last evening stable Increasing congestion, poor cough, [place] 4L  Objective:  Blood pressure 134/68, pulse 96,  temperature 98.8 F (37.1 C), temperature source Axillary, resp. rate (!) 24, height 5' 9.5" (1.765 m), weight 82.8 kg, SpO2 94%.    Vent Mode: PSV;CPAP FiO2 (%):  [40 %] 40 % PEEP:  [5 cmH20] 5 cmH20 Pressure Support:  [5 cmH20] 5 cmH20   Intake/Output Summary (Last 24 hours) at 06/29/2023 0858 Last data filed at 06/29/2023 0800 Gross per 24 hour  Intake 1701.14 ml  Output 1050 ml  Net 651.14 ml   Filed Weights   06/27/23 1900  Weight: 82.8 kg    Examination: General:  Elderly male sitting upright in bed in NAD HEENT: MM pink/moist, pupils 3/r, cortrak, aphasic, no gag Neuro: eyes open will track some, only looks left, spont movement in LUE but no other movement to noxious stimuli, intermittently has been following commands but not for me CV: rr, NSR/ ST PULM:  tachypneic, upper airway congestion cleared by NTS, diffuse exp wheeze, scattered rhonchi- s/p NTS with light greenish secretions GI: soft, bs+, NT, purwick Extremities: warm/dry, no LE edema  Skin: no rashes   tmax 99.5- uptrending UOP 1.8L  Net +2.5L  Labs reviewed> K 4, sCr 0.93, Na 139, bicarb 21, phos 2.4, Mag 2, WBC 11.1> 15  Labs/imaging personally reviewed:  ET head 5/6 > hyperdense left M1/M2 MCA concerning for thrombus.  No hemorrhage, no evidence of acute large vascular territory infarct.  Aspects 10. CTA head/neck 5/6 > occluded left ICA and left MCA, approximately 40% stenosis of the proximal right ICA, moderate stenosis of the right vertebral artery, moderate  stenosis of the right brachiocephalic and left subclavian arteries. MRI brain 5/7> Acute infarct affecting much of the left caudate and lentiform nuclei.  Patchy acute cortically-based infarcts scattered within the frontal, parietal and temporal lobes on the left (MCA and ACA vascular territories). Additional small acute infarct along the atrium of the left lateral ventricle.  Punctate acute cortical infarct within the perirolandic medial right  frontoparietal lobes.  Chronic lacunar infarct within the right corona radiata/basal ganglia.  Background moderate chronic small vessel ischemic changes within the cerebral white matter and pons. Paranasal sinus disease as described. CTH 5/7> Acute left MCA territory infarcts, similar when comparing across modalities. No evidence of mass occupying acute hemorrhage or progressive mass effect  Assessment & Plan:   L ICA and L MCAocclusion - s/p TNK  followed by L M2 thrombectomy, L ICA stent placement with angioplasty - Post procedure management per IR - Stroke workup / management per neuro. - ASA/ brilinta via cortrak - CTH overnight stable as above - aspiration precuations - serial neuro exams/ neuro protective measures - PT/ OT/ SLP  - SCDs for now, vte per neuro   Hx HTN, HLD Systolic HF- unclear if acute vs chronic, no prior echo - echo EF 35-40%, no RWMA, G1DD, normal RV - stop MIVF, lasix as below - goal SBP <180, remains within goal - prn labatelol,  hydralazine, cleviprex - hold pta losartan - statin per neuro - optimize electrolytes   Respiratory insufficiency - due to inability to protect the airway in the setting of above. Hx COPD R/o aspiration PNA +/- pulmonary edema Dysphagia  P:  - airway remains tenuous, high risk for reintubation.  Very weak cough, no gag.  Continue to monitor in ICU.  Change to [facility] for support.  Oxygenation stable, goal sat > 92% - lasix x 1 - cont triple nebs w/ prn albuterol   - start solumedrol x 5 days - send sputum for culture and start ceftriaxone given secretions - CXR ordered  - NTS prn  - remains npo, cortrak for meds/ nutrition - aspiration precautions    Hypophosphatemia  - kphos 15 mmol, trend on labs, replete prn   Hx DM - A1c 6.2 - cont prn SSI    Hx Parkinsons Disease, PTSD - cont Sinemet - awaiting med rec for PTA Gabapentin, Hydroxyzine, Ropinirole, Topiramate    At risk for malnutrition - start EN per RD  via cortak    Best practice (evaluated daily):  Diet/type: NPO; EN per RD DVT prophylaxis:  start VTE 24hrs post TNK  Pressure ulcer(s): pressure ulcer assessment deferred  GI prophylaxis: PPI Lines: N/A Foley:  N/A Code Status:  full code Last date of multidisciplinary goals of care discussion: per primary team  Pending 5/8, no family at bedside   Labs   CBC: Recent Labs  Lab 06/27/23 1954 06/27/23 2000 06/27/23 2335 06/28/23 0028 06/29/23 0607  WBC 11.7*  --   --  11.1* 15.0*  NEUTROABS 9.8*  --   --   --   --   HGB 14.1 14.3 12.6* 11.4* 12.8*  HCT 42.2 42.0 37.0* 34.3* 38.4*  MCV 96.3  --   --  96.9 96.5  PLT 179  --   --  162 189    Basic Metabolic Panel: Recent Labs  Lab 06/27/23 1954 06/27/23 2000 06/27/23 2335 06/28/23 0028 06/29/23 0607  NA 138 141 138 139 139  K 4.5 4.5 4.2 4.0 4.0  CL 105 104  --  106  110  CO2 24  --   --  23 21*  GLUCOSE 139* 137*  --  144* 139*  BUN 20 26*  --  18 12  CREATININE 1.40* 1.40*  --  1.09 0.93  CALCIUM 8.7*  --   --  7.5* 8.2*  MG  --   --   --  1.7 2.0  PHOS  --   --   --  2.8 2.4*   GFR: Estimated Creatinine Clearance: 67.6 mL/min (by C-G formula based on SCr of 0.93 mg/dL). Recent Labs  Lab 06/27/23 1954 06/28/23 0028 06/29/23 0607  WBC 11.7* 11.1* 15.0*    Liver Function Tests: Recent Labs  Lab 06/27/23 1954  AST 19  ALT 15  ALKPHOS 61  BILITOT 0.5  PROT 6.4*  ALBUMIN 3.4*   No results for input(s): "LIPASE", "AMYLASE" in the last 168 hours. No results for input(s): "AMMONIA" in the last 168 hours.  ABG    Component Value Date/Time   PHART 7.344 (L) 06/27/2023 2335   PCO2ART 47.2 06/27/2023 2335   PO2ART 238 (H) 06/27/2023 2335   HCO3 25.7 06/27/2023 2335   TCO2 27 06/27/2023 2335   O2SAT 100 06/27/2023 2335     Coagulation Profile: Recent Labs  Lab 06/27/23 1954  INR 1.0    Cardiac Enzymes: No results for input(s): "CKTOTAL", "CKMB", "CKMBINDEX", "TROPONINI" in the last 168  hours.  HbA1C: Hgb A1c MFr Bld  Date/Time Value Ref Range Status  06/28/2023 12:28 AM 6.2 (H) 4.8 - 5.6 % Final    Comment:    (NOTE) Pre diabetes:          5.7%-6.4%  Diabetes:              >6.4%  Glycemic control for   <7.0% adults with diabetes     CBG: Recent Labs  Lab 06/28/23 1543 06/28/23 1938 06/28/23 2337 06/29/23 0357 06/29/23 0712  GLUCAP 122* 117* 161* 153* 150*    Allergies Allergies  Allergen Reactions   Indomethacin Nausea And Vomiting and Other (See Comments)    Other reaction(s): Blurred vision Ear ringing      Home Medications  Prior to Admission medications   Medication Sig Start Date End Date Taking? Authorizing Provider  albuterol  (PROVENTIL ) (2.5 MG/3ML) 0.083% nebulizer solution Take 3 mLs (2.5 mg total) by nebulization every 6 (six) hours as needed for wheezing or shortness of breath. 07/21/21   Desai, Nikita S, MD  albuterol  (VENTOLIN  HFA) 108 [phone] Base) MCG/ACT inhaler INHALE 2 PUFFS BY MOUTH FOUR TIMES A DAY AS NEEDED FOR WHEEZING, SHORTNESS OF BREATHE, AND COUGH 11/23/20   [provider]  Budeson-Glycopyrrol-Formoterol (BREZTRI  AEROSPHERE) 160-9-4.8 MCG/ACT AERO Inhale 2 puffs into the lungs in the morning and at bedtime. 10/07/22   Cobb, Mariah Shines, NP  carbidopa-levodopa (SINEMET IR) 10-100 MG tablet Take 1 tablet by mouth 3 (three) times daily.    [provider]  celecoxib (CELEBREX) 200 MG capsule Take 1 tablet by mouth daily. 01/12/10   [provider]  ezetimibe (ZETIA) 10 MG tablet TAKE ONE-HALF TABLET BY MOUTH DAILY FOR CHOLESTEROL CONTROL 11/23/20   [provider]  gabapentin (NEURONTIN) 600 MG tablet Take by mouth.    [provider]  hydrOXYzine (ATARAX) 10 MG tablet Take by mouth.    [provider]  metoprolol succinate (TOPROL-XL) 50 MG 24 hr tablet Take by mouth.    [provider]  omeprazole (PRILOSEC) 20 MG capsule Take by mouth.  [provider]   predniSONE  (DELTASONE ) 10 MG tablet 4 tabs for 2 days, then 3 tabs for 2 days, 2 tabs for 2 days, then 1 tab for 2 days, then stop 10/07/22   Cobb, Katherine V, NP  pyridoxine (B-6) 100 MG tablet Take by mouth.    [provider]  ranitidine (ZANTAC) 300 MG capsule Take by mouth.    [provider]  rOPINIRole (REQUIP) 0.25 MG tablet Take by mouth.    [provider]  topiramate (TOPAMAX) 100 MG tablet Take by mouth.    [provider]     Critical care time: 36 min      Early Glisson, MSN, AG-ACNP-BC [facility] 06/29/2023, 8:58 AM  See Amion for pager If no response to pager , please call [phone] until 7pm After 7:00 pm call Elink  [phone]

## 2023-06-29 NOTE — Evaluation (Addendum)
 Physical Therapy Evaluation Patient Details Name: Rickey Baker MRN: [id number] DOB: 1945/12/10 Today's Date: 06/29/2023  History of Present Illness  Pt is a 78 y/o male presenting on 5/6 with R sided weakness and aphasia. 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 Received TNK and s/p left M2 thrombectomy, L ICA stent placement with angioplasty. Extubated 5/7. PMH includes: arthritis, COPD, HTN, parkinson's disease, PTSD.  Clinical Impression  Pt admitted with above. Pt with no verbalizations attempts, but follows intermittent gestural and verbal commands with left side during session. Pt demonstrates flicker of RUE movement, and withdraws RLE to painful stimuli. Left gaze preference, but able to cross midline. Pt requiring +2 assist for transition to left side of bed. Demonstrates right lateral and posterior lean. Able to stand from edge of bed x 2 with right knee block. Upon return to bed, placed in chair position to promote upright and improve respiratory status. Patient will benefit from intensive inpatient follow-up therapy, >3 hours/day in order to address deficits and maximize functional mobility.         If plan is discharge home, recommend the following: Two people to help with walking and/or transfers;Two people to help with bathing/dressing/bathroom   Can travel by private vehicle        Equipment Recommendations Other (comment) (TBA)  Recommendations for Other Services  Rehab consult    Functional Status Assessment Patient has had a recent decline in their functional status and demonstrates the ability to make significant improvements in function in a reasonable and predictable amount of time.     Precautions / Restrictions Precautions Precautions: Fall Recall of Precautions/Restrictions: Impaired Precaution/Restrictions Comments: cortrak, R hemi Restrictions Weight Bearing Restrictions Per Provider Order: No      Mobility  Bed Mobility Overal bed mobility: Needs  Assistance Bed Mobility: Supine to Sit, Sit to Supine     Supine to sit: Max assist, +2 for physical assistance Sit to supine: Total assist, +2 for physical assistance   General bed mobility comments: requires assist for intation and sequencing, able to assist minimally with L side    Transfers Overall transfer level: Needs assistance Equipment used: 2 person hand held assist Transfers: Sit to/from Stand Sit to Stand: Max assist, +2 physical assistance           General transfer comment: Standing from edge of bed x 2 with use of bed pad to boost hips and R knee block.    Ambulation/Gait                  Stairs            Wheelchair Mobility     Tilt Bed    Modified Rankin (Stroke Patients Only) Modified Rankin (Stroke Patients Only) Pre-Morbid Rankin Score: No symptoms Modified Rankin: Severe disability     Balance Overall balance assessment: Needs assistance Sitting-balance support: No upper extremity supported, Feet supported, Single extremity supported Sitting balance-Leahy Scale: Poor Sitting balance - Comments: min at best, max mostly with R lateral and posterior lean Postural control: Posterior lean, Right lateral lean Standing balance support: Bilateral upper extremity supported, During functional activity Standing balance-Leahy Scale: Zero                               Pertinent Vitals/Pain Pain Assessment Pain Assessment: Faces Faces Pain Scale: No hurt    Home Living Family/patient expects to be discharged to:: Private residence Living Arrangements: Spouse/significant other  Additional Comments: per chart home with spouse but pt unable to provide home setup    Prior Function Prior Level of Function : Independent/Modified Independent;Patient poor historian/Family not available               ADLs Comments: per chart independent but pt unable to provide PLOF and family not present      Extremity/Trunk Assessment   Upper Extremity Assessment Upper Extremity Assessment: Defer to OT evaluation RUE Deficits / Details: difficult to assess due to impaired commuincation but noted flicker of movement in hand and attempts to raise UE minimally.  grossly flaccid with no functional use. no response to noxious stimuli. RUE Sensation: decreased light touch;decreased proprioception RUE Coordination: decreased fine motor;decreased gross motor    Lower Extremity Assessment Lower Extremity Assessment: RLE deficits/detail;LLE deficits/detail RLE Deficits / Details: Withdraws to pain LLE Deficits / Details: Moves spontaneously       Communication   Communication Communication: Impaired Factors Affecting Communication: Difficulty expressing self (no attempted verbalizations)    Cognition Arousal: Alert Behavior During Therapy: Flat affect   PT - Cognitive impairments: Difficult to assess Difficult to assess due to: Impaired communication                     PT - Cognition Comments: Expressive > receptive aphasia, no verbalizations, will intermittently nod yes to questions. Followed 1-2 verbal commands, and able to mimic commands ~50-75% of the time Following commands: Impaired Following commands impaired: Follows one step commands inconsistently, Follows one step commands with increased time     Cueing Cueing Techniques: Verbal cues, Gestural cues, Visual cues, Tactile cues     General Comments General comments (skin integrity, edema, etc.): VSS on 5L [place] during session. BP 132/81 HR 98 SpO2 93-96%    Exercises     Assessment/Plan    PT Assessment Patient needs continued PT services  PT Problem List Decreased strength;Decreased activity tolerance;Decreased balance;Decreased mobility;Decreased cognition;Cardiopulmonary status limiting activity;Impaired tone;Impaired sensation       PT Treatment Interventions DME instruction;Gait training;Functional mobility  training;Therapeutic activities;Therapeutic exercise;Balance training;Patient/family education;Cognitive remediation;Neuromuscular re-education;Wheelchair mobility training    PT Goals (Current goals can be found in the Care Plan section)  Acute Rehab PT Goals Patient Stated Goal: unable to state PT Goal Formulation: Patient unable to participate in goal setting Time For Goal Achievement: 07/13/23 Potential to Achieve Goals: Good    Frequency Min 3X/week     Co-evaluation PT/OT/SLP Co-Evaluation/Treatment: Yes Reason for Co-Treatment: For patient/therapist safety;To address functional/ADL transfers;Necessary to address cognition/behavior during functional activity;Complexity of the patient's impairments (multi-system involvement) PT goals addressed during session: Mobility/safety with mobility OT goals addressed during session: ADL's and self-care       AM-PAC PT "6 Clicks" Mobility  Outcome Measure Help needed turning from your back to your side while in a flat bed without using bedrails?: Total Help needed moving from lying on your back to sitting on the side of a flat bed without using bedrails?: Total Help needed moving to and from a bed to a chair (including a wheelchair)?: Total Help needed standing up from a chair using your arms (e.g., wheelchair or bedside chair)?: Total Help needed to walk in hospital room?: Total Help needed climbing 3-5 steps with a railing? : Total 6 Click Score: 6    End of Session Equipment Utilized During Treatment: Gait belt;Oxygen Activity Tolerance: Patient tolerated treatment well Patient left: in bed;with call bell/phone within reach;with bed alarm set;Other (comment) (bed in chair  position) Nurse Communication: Mobility status PT Visit Diagnosis: Hemiplegia and hemiparesis;Difficulty in walking, not elsewhere classified (R26.2) Hemiplegia - Right/Left: Right Hemiplegia - caused by: Cerebral infarction    Time: 0900-0930 PT Time  Calculation (min) (ACUTE ONLY): 30 min   Charges:   PT Evaluation $PT Eval Moderate Complexity: 1 Mod   PT General Charges $$ ACUTE PT VISIT: 1 Visit         Verdia Glad, PT, DPT Acute Rehabilitation Services Office [phone]   Claria Crofts 06/29/2023, 11:49 AM

## 2023-06-30 DIAGNOSIS — J9589 Other postprocedural complications and disorders of respiratory system, not elsewhere classified: Secondary | ICD-10-CM | POA: Diagnosis not present

## 2023-06-30 DIAGNOSIS — J690 Pneumonitis due to inhalation of food and vomit: Secondary | ICD-10-CM

## 2023-06-30 DIAGNOSIS — E119 Type 2 diabetes mellitus without complications: Secondary | ICD-10-CM | POA: Diagnosis not present

## 2023-06-30 DIAGNOSIS — I10 Essential (primary) hypertension: Secondary | ICD-10-CM | POA: Diagnosis not present

## 2023-06-30 DIAGNOSIS — I69391 Dysphagia following cerebral infarction: Secondary | ICD-10-CM | POA: Diagnosis not present

## 2023-06-30 DIAGNOSIS — I6522 Occlusion and stenosis of left carotid artery: Secondary | ICD-10-CM | POA: Diagnosis not present

## 2023-06-30 DIAGNOSIS — I63512 Cerebral infarction due to unspecified occlusion or stenosis of left middle cerebral artery: Secondary | ICD-10-CM | POA: Diagnosis not present

## 2023-06-30 LAB — BASIC METABOLIC PANEL WITH GFR
Anion gap: 10 (ref 5–15)
BUN: 26 mg/dL — ABNORMAL HIGH (ref 8–23)
CO2: 24 mmol/L (ref 22–32)
Calcium: 8.5 mg/dL — ABNORMAL LOW (ref 8.9–10.3)
Chloride: 106 mmol/L (ref 98–111)
Creatinine, Ser: 1.02 mg/dL (ref 0.61–1.24)
GFR, Estimated: >60 mL/min (ref >60)
Glucose, Bld: 147 mg/dL — ABNORMAL HIGH (ref 70–99)
Potassium: 3.8 mmol/L (ref 3.5–5.1)
Sodium: 140 mmol/L (ref 135–145)

## 2023-06-30 LAB — CBC
HCT: 37.3 % — ABNORMAL LOW (ref 39.0–52.0)
Hemoglobin: 12.7 g/dL — ABNORMAL LOW (ref 13.0–17.0)
MCH: 32.4 pg (ref 26.0–34.0)
MCHC: 34 g/dL (ref 30.0–36.0)
MCV: 95.2 fL (ref 80.0–100.0)
Platelets: 209 10*3/uL (ref 150–400)
RBC: 3.92 MIL/uL — ABNORMAL LOW (ref 4.22–5.81)
RDW: 13 % (ref 11.5–15.5)
WBC: 16.4 10*3/uL — ABNORMAL HIGH (ref 4.0–10.5)
nRBC: 0 % (ref 0.0–0.2)

## 2023-06-30 LAB — GLUCOSE, CAPILLARY
Glucose-Capillary: 146 mg/dL — ABNORMAL HIGH (ref 70–99)
Glucose-Capillary: 172 mg/dL — ABNORMAL HIGH (ref 70–99)
Glucose-Capillary: 152 mg/dL — ABNORMAL HIGH (ref 70–99)
Glucose-Capillary: 199 mg/dL — ABNORMAL HIGH (ref 70–99)
Glucose-Capillary: 198 mg/dL — ABNORMAL HIGH (ref 70–99)

## 2023-06-30 LAB — MAGNESIUM: Magnesium: 2.2 mg/dL (ref 1.7–2.4)

## 2023-06-30 LAB — PHOSPHORUS: Phosphorus: 2.9 mg/dL (ref 2.5–4.6)

## 2023-06-30 MED ORDER — SENNOSIDES-DOCUSATE SODIUM 8.6-50 MG PO TABS
1.0000 | ORAL_TABLET | Freq: Two times a day (BID) | ORAL | Status: DC
  Administered 2023-06-30 – 2023-07-06 (×14): 1
  Filled 2023-06-30 (×14): qty 1

## 2023-06-30 MED ORDER — POTASSIUM CHLORIDE 20 MEQ PO PACK
40.0000 meq | PACK | Freq: Once | ORAL | Status: AC
  Administered 2023-06-30: 40 meq
  Filled 2023-06-30: qty 2

## 2023-06-30 MED ORDER — LOSARTAN POTASSIUM 50 MG PO TABS
25.0000 mg | ORAL_TABLET | Freq: Every day | ORAL | Status: DC
  Administered 2023-06-30 – 2023-07-02 (×3): 25 mg
  Filled 2023-06-30 (×3): qty 1

## 2023-06-30 MED ORDER — BISACODYL 10 MG RE SUPP
10.0000 mg | Freq: Once | RECTAL | Status: AC
  Administered 2023-06-30: 10 mg via RECTAL
  Filled 2023-06-30: qty 1

## 2023-06-30 MED ORDER — FUROSEMIDE 10 MG/ML IJ SOLN
40.0000 mg | Freq: Four times a day (QID) | INTRAMUSCULAR | Status: AC
  Administered 2023-06-30 (×2): 40 mg via INTRAVENOUS
  Filled 2023-06-30 (×2): qty 4

## 2023-06-30 NOTE — Progress Notes (Signed)
 Physical Therapy Treatment Patient Details Name: Rickey Baker MRN: [id number] DOB: 1945-08-28 Today's Date: 06/30/2023   History of Present Illness Pt is a 78 y/o male presenting on 5/6 with R sided weakness and aphasia. 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 Received TNK and s/p left M2 thrombectomy, L ICA stent placement with angioplasty. Extubated 5/7. PMH includes: arthritis, COPD, HTN, parkinson's disease, PTSD.    PT Comments  Pt with improved command following, but no attempts at verbalization. Requiring +2 assist for bed mobility and transfers to chair using Stedy. Tolerated well. Continues with R  hemiplegia, impaired sitting balance, communication, and cognition. Patient will benefit from intensive inpatient follow-up therapy, >3 hours/day.     If plan is discharge home, recommend the following: Two people to help with walking and/or transfers;Two people to help with bathing/dressing/bathroom   Can travel by private vehicle        Equipment Recommendations  Other (comment) (TBA)    Recommendations for Other Services Rehab consult     Precautions / Restrictions Precautions Precautions: Fall Recall of Precautions/Restrictions: Impaired Precaution/Restrictions Comments: cortrak, R hemi Restrictions Weight Bearing Restrictions Per Provider Order: No     Mobility  Bed Mobility Overal bed mobility: Needs Assistance Bed Mobility: Supine to Sit     Supine to sit: Max assist, +2 for physical assistance     General bed mobility comments: Pt initiating bringing LLE off edge of bed with cueing, assist for R sided management    Transfers Overall transfer level: Needs assistance Equipment used: Ambulation equipment used Transfers: Sit to/from Stand Sit to Stand: Max assist, +2 physical assistance           General transfer comment: Stedy to transfer from bed to chair, pt pulling up with L side; unable to achieve upright standing Transfer via Lift Equipment:  Stedy  Ambulation/Gait                   Stairs             Wheelchair Mobility     Tilt Bed    Modified Rankin (Stroke Patients Only) Modified Rankin (Stroke Patients Only) Pre-Morbid Rankin Score: No symptoms Modified Rankin: Severe disability     Balance Overall balance assessment: Needs assistance Sitting-balance support: No upper extremity supported, Feet supported, Single extremity supported Sitting balance-Leahy Scale: Poor Sitting balance - Comments: Posterior lean, requiring min-modA                                    Communication Communication Communication: Impaired Factors Affecting Communication: Difficulty expressing self (no attempted verbalizations)  Cognition Arousal: Alert Behavior During Therapy: Flat affect   PT - Cognitive impairments: Difficult to assess Difficult to assess due to: Impaired communication                     PT - Cognition Comments: Expressive > receptive aphasia, increased verbal command following today, no verbalizations, however, RN reporting pt told her his name this AM Following commands: Impaired Following commands impaired: Follows one step commands inconsistently, Follows one step commands with increased time    Cueing Cueing Techniques: Verbal cues, Gestural cues, Visual cues, Tactile cues  Exercises      General Comments        Pertinent Vitals/Pain Pain Assessment Pain Assessment: Faces Faces Pain Scale: No hurt    Home Living  Prior Function            PT Goals (current goals can now be found in the care plan section) Acute Rehab PT Goals Patient Stated Goal: unable to state PT Goal Formulation: Patient unable to participate in goal setting Time For Goal Achievement: 07/13/23 Potential to Achieve Goals: Good Progress towards PT goals: Progressing toward goals    Frequency    Min 3X/week      PT Plan       Co-evaluation              AM-PAC PT "6 Clicks" Mobility   Outcome Measure  Help needed turning from your back to your side while in a flat bed without using bedrails?: Total Help needed moving from lying on your back to sitting on the side of a flat bed without using bedrails?: Total Help needed moving to and from a bed to a chair (including a wheelchair)?: Total Help needed standing up from a chair using your arms (e.g., wheelchair or bedside chair)?: Total Help needed to walk in hospital room?: Total Help needed climbing 3-5 steps with a railing? : Total 6 Click Score: 6    End of Session Equipment Utilized During Treatment: Gait belt;Oxygen Activity Tolerance: Patient tolerated treatment well Patient left: with call bell/phone within reach;in chair;with chair alarm set Nurse Communication: Mobility status;Need for lift equipment PT Visit Diagnosis: Hemiplegia and hemiparesis;Difficulty in walking, not elsewhere classified (R26.2) Hemiplegia - Right/Left: Right Hemiplegia - caused by: Cerebral infarction     Time: 1010-1039 PT Time Calculation (min) (ACUTE ONLY): 29 min  Charges:    $Therapeutic Activity: 23-37 mins PT General Charges $$ ACUTE PT VISIT: 1 Visit                     Verdia Glad, PT, DPT Acute Rehabilitation Services Office [phone]    Claria Crofts 06/30/2023, 11:18 AM

## 2023-06-30 NOTE — Plan of Care (Signed)
  Problem: Education: Goal: Knowledge of disease or condition will improve Outcome: Progressing Goal: Knowledge of secondary prevention will improve (MUST DOCUMENT ALL) Outcome: Progressing Goal: Knowledge of patient specific risk factors will improve (DELETE if not current risk factor) Outcome: Progressing   Problem: Ischemic Stroke/TIA Tissue Perfusion: Goal: Complications of ischemic stroke/TIA will be minimized Outcome: Progressing   Problem: Coping: Goal: Will verbalize positive feelings about self Outcome: Progressing Goal: Will identify appropriate support needs Outcome: Progressing   Problem: Health Behavior/Discharge Planning: Goal: Ability to manage health-related needs will improve Outcome: Progressing Goal: Goals will be collaboratively established with patient/family Outcome: Progressing   Problem: Self-Care: Goal: Ability to participate in self-care as condition permits will improve Outcome: Progressing Goal: Verbalization of feelings and concerns over difficulty with self-care will improve Outcome: Progressing Goal: Ability to communicate needs accurately will improve Outcome: Progressing   Problem: Nutrition: Goal: Risk of aspiration will decrease Outcome: Progressing Goal: Dietary intake will improve Outcome: Progressing   Problem: Education: Goal: Knowledge of General Education information will improve Description: Including pain rating scale, medication(s)/side effects and non-pharmacologic comfort measures Outcome: Progressing   Problem: Health Behavior/Discharge Planning: Goal: Ability to manage health-related needs will improve Outcome: Progressing   Problem: Clinical Measurements: Goal: Ability to maintain clinical measurements within normal limits will improve Outcome: Progressing Goal: Will remain free from infection Outcome: Progressing Goal: Diagnostic test results will improve Outcome: Progressing Goal: Respiratory complications will  improve Outcome: Progressing Goal: Cardiovascular complication will be avoided Outcome: Progressing

## 2023-06-30 NOTE — Progress Notes (Incomplete)
{  PT ALL NOTES:[id number]} 

## 2023-06-30 NOTE — Evaluation (Signed)
 Speech Language Pathology Evaluation Patient Details Name: Rickey Baker MRN: [id number] DOB: 1945/06/26 Today's Date: 06/30/2023 Time: 0940-1010 SLP Time Calculation (min) (ACUTE ONLY): 30 min  Problem List:  Patient Active Problem List   Diagnosis Date Noted   Malnutrition of moderate degree 06/29/2023   Stroke (cerebrum) (HCC) 06/27/2023   Hypertension associated with diabetes (HCC) 06/27/2023   Acute hypoxic respiratory failure (HCC) 10/07/2022   COPD with acute exacerbation (HCC) 10/07/2022   Past Medical History:  Past Medical History:  Diagnosis Date   Arthritis    COPD (chronic obstructive pulmonary disease) (HCC)    Hyperlipidemia    Hypertension    Parkinson's disease (HCC)    PTSD (post-traumatic stress disorder)    Past Surgical History:  Past Surgical History:  Procedure Laterality Date   BACK SURGERY     IR CT HEAD LTD  06/27/2023   IR INTRAVSC STENT CERV CAROTID W/O EMB-PROT MOD SED INC ANGIO  06/27/2023   IR PERCUTANEOUS ART THROMBECTOMY/INFUSION INTRACRANIAL INC DIAG ANGIO  06/27/2023   IR US  GUIDE VASC ACCESS RIGHT  06/27/2023   RADIOLOGY WITH ANESTHESIA N/A 06/27/2023   Procedure: RADIOLOGY WITH ANESTHESIA;  Surgeon: Luellen Sages, MD;  Location: [facility] OR;  Service: Radiology;  Laterality: N/A;   HPI:  Pt is a 78 y/o male presenting on 5/6 with R sided weakness and aphasia. 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 Received TNK and s/p left M2 thrombectomy, L ICA stent placement with angioplasty. Extubated 5/7. PMH includes: arthritis, COPD, HTN, parkinson's disease, PTSD.   Assessment / Plan / Recommendation Clinical Impression  Pt demonstrates significant difficulty initiating speech and language, suggesting a mixed apraxia and aphasia. Pt is able to say his name, count 1, 2 with max cues and repeat a word with significant articulatory errors. Pt also able to phonate on command with poor breath support and dysphonic vocal quality. Pt is otherwise silent. He is  100% accurate in pointing to 6/6 images on command and completing simple distal commands. However, he cannot follow oral commands. He is 75% accurate in Y/N biographical questions. Comprehension better than verbal ability, though there are also comprehension impairment. Would benefit from intensive SLP intervention.    SLP Assessment  SLP Recommendation/Assessment: Patient needs continued Speech Lanaguage Pathology Services SLP Visit Diagnosis: Apraxia (R48.2);Aphasia (R47.01)    Recommendations for follow up therapy are one component of a multi-disciplinary discharge planning process, led by the attending physician.  Recommendations may be updated based on patient status, additional functional criteria and insurance authorization.    Follow Up Recommendations  Acute inpatient rehab (3hours/day)    Assistance Recommended at Discharge     Functional Status Assessment Patient has had a recent decline in their functional status and demonstrates the ability to make significant improvements in function in a reasonable and predictable amount of time.  Frequency and Duration min 2x/week  2 weeks      SLP Evaluation Cognition  Overall Cognitive Status: Impaired/Different from baseline Arousal/Alertness: Awake/alert Orientation Level: Oriented to person Attention: Focused;Sustained Focused Attention: Appears intact Sustained Attention: Impaired Sustained Attention Impairment: Verbal basic;Functional basic       Comprehension  Auditory Comprehension Overall Auditory Comprehension: Impaired Yes/No Questions: Impaired Basic Biographical Questions: 76-100% accurate Basic Immediate Environment Questions: 25-49% accurate Commands: Impaired One Step Basic Commands: 50-74% accurate Conversation: Simple Interfering Components: Motor planning Reading Comprehension Reading Status: Not tested    Expression Verbal Expression Overall Verbal Expression: Impaired Initiation: Impaired Automatic  Speech: Name;Counting Level of Generative/Spontaneous Verbalization:  Word Repetition: Impaired Level of Impairment: Word level Naming: Impairment Responsive: Not tested Confrontation: Impaired Convergent: Not tested Verbal Errors: Not aware of errors Pragmatics: Unable to assess Interfering Components: Speech intelligibility   Oral / Motor  Oral Motor/Sensory Function Overall Oral Motor/Sensory Function: Other (comment) Motor Speech Overall Motor Speech: Impaired Respiration: Impaired Level of Impairment: Word Phonation: Breathy;Hoarse Articulation: Impaired Level of Impairment: Word Intelligibility: Intelligibility reduced Word: 25-49% accurate Phrase: Not tested Sentence: Not tested Conversation: Not tested Motor Planning: Impaired Level of Impairment: Word Motor Speech Errors: Groping for words;Inconsistent            Kay Ricciuti, [name] 06/30/2023, 10:40 AM

## 2023-06-30 NOTE — Evaluation (Signed)
 Clinical/Bedside Swallow Evaluation Patient Details  Name: Rickey Baker MRN: [id number] Date of Birth: 12-31-1945  Today's Date: 06/30/2023 Time: SLP Start Time (ACUTE ONLY): 0940 SLP Stop Time (ACUTE ONLY): 1010 SLP Time Calculation (min) (ACUTE ONLY): 30 min  Past Medical History:  Past Medical History:  Diagnosis Date   Arthritis    COPD (chronic obstructive pulmonary disease) (HCC)    Hyperlipidemia    Hypertension    Parkinson's disease (HCC)    PTSD (post-traumatic stress disorder)    Past Surgical History:  Past Surgical History:  Procedure Laterality Date   BACK SURGERY     IR CT HEAD LTD  06/27/2023   IR INTRAVSC STENT CERV CAROTID W/O EMB-PROT MOD SED INC ANGIO  06/27/2023   IR PERCUTANEOUS ART THROMBECTOMY/INFUSION INTRACRANIAL INC DIAG ANGIO  06/27/2023   IR US  GUIDE VASC ACCESS RIGHT  06/27/2023   RADIOLOGY WITH ANESTHESIA N/A 06/27/2023   Procedure: RADIOLOGY WITH ANESTHESIA;  Surgeon: Luellen Sages, MD;  Location: [facility] OR;  Service: Radiology;  Laterality: N/A;   HPI:  Pt is a 78 y/o male presenting on 5/6 with R sided weakness and aphasia. 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 Received TNK and s/p left M2 thrombectomy, L ICA stent placement with angioplasty. Extubated 5/7. PMH includes: arthritis, COPD, HTN, parkinson's disease, PTSD.    Assessment / Plan / Recommendation  Clinical Impression  Pt demonstrates significant oropharyngeal dysphagia. Pt has difficulty following oral motor commands or initiating appropriate oral movements for PO intake. Pt unable to seal lips to cup edge or straw though attentive. He cannot create negative pressure for sipping; mostly blows air into straw and cup. If given ice pt has oral holding, difficulty manipulating. There are late swallows, but they are followed by weak dysphonic coughing. Pt appears to have apraxia and decreased airway protection following intubation and aspiration. Recommend pt remains NPO for now, until cough  strength is more effective for airway protection. Will continue PO trials and readiness for instrumental testing in the coming days. SLP Visit Diagnosis: Dysphagia, oropharyngeal phase (R13.12)    Aspiration Risk  Severe aspiration risk    Diet Recommendation NPO;Alternative means - temporary         Other  Recommendations Oral Care Recommendations: Oral care QID Caregiver Recommendations: Have oral suction available    Recommendations for follow up therapy are one component of a multi-disciplinary discharge planning process, led by the attending physician.  Recommendations may be updated based on patient status, additional functional criteria and insurance authorization.  Follow up Recommendations Acute inpatient rehab (3hours/day)      Assistance Recommended at Discharge    Functional Status Assessment Patient has had a recent decline in their functional status and demonstrates the ability to make significant improvements in function in a reasonable and predictable amount of time.  Frequency and Duration min 2x/week  2 weeks       Prognosis Prognosis for improved oropharyngeal function: Fair Barriers to Reach Goals: Severity of deficits      Swallow Study   General HPI: Pt is a 78 y/o male presenting on 5/6 with R sided weakness and aphasia. 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 Received TNK and s/p left M2 thrombectomy, L ICA stent placement with angioplasty. Extubated 5/7. PMH includes: arthritis, COPD, HTN, parkinson's disease, PTSD. Type of Study: Bedside Swallow Evaluation Diet Prior to this Study: NPO Temperature Spikes Noted: Yes Respiratory Status: Nasal cannula History of Recent Intubation: Yes Total duration of intubation (days): 2 days  Date extubated: 06/28/23 Behavior/Cognition: Cooperative;Lethargic/Drowsy;Requires cueing Oral Cavity Assessment: Within Functional Limits Oral Care Completed by SLP: Recent completion by staff Oral Cavity - Dentition:  Adequate natural dentition Vision: Functional for self-feeding Self-Feeding Abilities: Able to feed self Patient Positioning: Upright in bed Baseline Vocal Quality: Low vocal intensity;Hoarse Volitional Cough: Weak;Congested    Oral/Motor/Sensory Function Overall Oral Motor/Sensory Function: Other (comment) (difficulty following oral motor commands, right weakness)   Ice Chips Ice chips: Impaired Presentation: Spoon Oral Phase Impairments: Reduced labial seal;Impaired mastication;Reduced lingual movement/coordination Oral Phase Functional Implications: Oral residue;Prolonged oral transit Pharyngeal Phase Impairments: Cough - Delayed   Thin Liquid Thin Liquid: Impaired Presentation: Cup;Straw Oral Phase Impairments: Reduced lingual movement/coordination;Reduced labial seal Oral Phase Functional Implications: Right anterior spillage;Right lateral sulci pocketing;Prolonged oral transit;Oral residue Pharyngeal  Phase Impairments: Cough - Delayed    Nectar Thick Nectar Thick Liquid: Not tested   Honey Thick Honey Thick Liquid: Not tested   Puree Puree: Not tested   Solid     Solid: Not tested      Rickey Baker, Rickey Baker 06/30/2023,10:26 AM

## 2023-06-30 NOTE — Progress Notes (Signed)
 NAME:  Rickey Baker, MRN:  [id number], DOB:  1945/05/04, LOS: 3 ADMISSION DATE:  06/27/2023, CONSULTATION DATE:  06/27/23 REFERRING MD:  Alecia Ames CHIEF COMPLAINT:  Vent Management   History of Present Illness:  Pt is encephelopathic; therefore, this HPI is obtained from chart review. Rickey Baker is a 78 y.o. male who has a PMH as below clued but not limited to hypertension, hyperlipidemia, Parkinson disease.  He presented to [facility] ED on 06/27/2023 with right-sided weakness and aphasia.  He was in his normal state of health earlier in the evening when his wife helped him lie down for a nap around 5 PM.  When she woke up later, she found that he was slumped to the side and unable to talk.  She called 911 and he was brought to the ED as a code stroke.  CT revealed no hemorrhage, CTA confirmed a left ICA/MCA occlusion.  He did receive TNKase  at 2004.  He was then taken to neuro IR where he had a left M2 thrombectomy, L ICA stent placement with angioplasty performed.  Just prior to the procedure he apparently had a vomiting episode requiring suctioning therefore, following the procedure, he remained intubated and was transferred to the ICU PCCM was subsequently called in consultation for vent management. Per CRNA, he was awake during vomiting episode and had a strong cough during suctioning and there was NOT any concern for aspiration event.  Pertinent  Medical History:  has Acute hypoxic respiratory failure (HCC); COPD with acute exacerbation (HCC); Stroke (cerebrum) (HCC); Hypertension associated with diabetes (HCC); and Malnutrition of moderate degree on their problem list.  Significant Hospital Events: Including procedures, antibiotic start and stop dates in addition to other pertinent events   5/6 admit, to [facility] for left M2 thrombectomy, L ICA stent placement with angioplasty.  MRI s 5/7 CTH stable, increasing congestion, aspiration coverage started, diuresis  Interim History / Subjective:  Stable  on 4L Verbalizing and following commands on LE  Objective:  Blood pressure (!) 161/81, pulse (!) 59, temperature 99 F (37.2 C), temperature source Axillary, resp. rate (!) 22, height 5' 9.5" (1.765 m), weight 83.3 kg, SpO2 91%.        Intake/Output Summary (Last 24 hours) at 06/30/2023 4098 Last data filed at 06/29/2023 2000 Gross per 24 hour  Intake 563.13 ml  Output 1600 ml  Net -1036.87 ml   Filed Weights   06/27/23 1900 06/30/23 0500  Weight: 82.8 kg 83.3 kg    Examination: General:  Elderly male sitting upright in bed in NAD HEENT: MM pink/moist, pupils 3/r, cortrak Neuro: Awake, slowed responses but able to say name and nods appropriately, follows commands 3/5 on LUE/ LLE, flaccid on left  CV: rr, ST, no murmur PULM:  non labored, remains tachypneic lower 20s, coarse, scattered rhonchi/ exp wheeze, no upper airway congestion or stridor, no oral secretions, better cough today and better head control, on 4L [place] GI: soft, bs+, NT, purwick- no BM Extremities: warm/dry, np generalized edema  Skin: no rashes  afebrile UOP 1.6L  Net +1.6L  Labs reviewed> K 3.8, sCr 1.02, WBC 15> 16.4  Labs/imaging personally reviewed:  ET head 5/6 > hyperdense left M1/M2 MCA concerning for thrombus.  No hemorrhage, no evidence of acute large vascular territory infarct.  Aspects 10. CTA head/neck 5/6 > occluded left ICA and left MCA, approximately 40% stenosis of the proximal right ICA, moderate stenosis of the right vertebral artery, moderate stenosis of the right brachiocephalic and left subclavian arteries.  MRI brain 5/7> Acute infarct affecting much of the left caudate and lentiform nuclei.  Patchy acute cortically-based infarcts scattered within the frontal, parietal and temporal lobes on the left (MCA and ACA vascular territories). Additional small acute infarct along the atrium of the left lateral ventricle.  Punctate acute cortical infarct within the perirolandic medial right frontoparietal  lobes.  Chronic lacunar infarct within the right corona radiata/basal ganglia.  Background moderate chronic small vessel ischemic changes within the cerebral white matter and pons. Paranasal sinus disease as described. CTH 5/7> Acute left MCA territory infarcts, similar when comparing across modalities. No evidence of mass occupying acute hemorrhage or progressive mass effect  Assessment & Plan:   L ICA and L MCAocclusion - s/p TNK  followed by L M2 thrombectomy, L ICA stent placement with angioplasty - per Stroke team/ NIR - ASA/ brilinta  via cortrak - slowly improving neuro exams, cont to monitor - aspiration precuations - PT/ OT/ SLP  - starting VTE ppx per neuro   Hx HTN, HLD Systolic HF- unclear if acute vs chronic, no prior echo - echo EF 35-40%, no RWMA, G1DD, normal RV - continue lasix  today, sCr ok, strict I/Os - goal SBP <180 - prn labatelol,  hydralazine , cleviprex  - restart losartan - statin per neuro - optimize electrolytes   Respiratory insufficiency, resolved AECOPD R/o aspiration PNA Dysphagia  P:  - cont supplemental O2 for sat goal > 90% - remains airway risk but less tenuous today - cont triple nebs w/ prn albuterol   - cont solumedrol x 5 days - follow sputum cx, cont ctx for aspiration coverage - NTS prn  - remains npo, cortrak for meds/ nutrition, SLP following - aggressive pulm hygiene- IS, flutter, hopeful OOB today - aspiration precautions  - PPI - PCCM follow while in ICU   Hypophosphatemia  - stable, trend on labs   Hx DM - A1c 6.2 - cont prn SSI    Hx Parkinsons Disease, PTSD - cont Sinemet  - still need to clarify home meds> gabapentin, Hydroxyzine, Ropinirole , Topiramate > defer to primary team   At risk for malnutrition - EN per RD via cortak    Best practice (evaluated daily):  Diet/type: NPO; EN per RD DVT prophylaxis:  start VTE  Pressure ulcer(s): pressure ulcer assessment deferred  GI prophylaxis: PPI Lines:  N/A Foley:  N/A Code Status:  full code Last date of multidisciplinary goals of care discussion: per primary team  No family at bedside  Labs   CBC: Recent Labs  Lab 06/27/23 1954 06/27/23 2000 06/27/23 2335 06/28/23 0028 06/29/23 0607 06/30/23 0518  WBC 11.7*  --   --  11.1* 15.0* 16.4*  NEUTROABS 9.8*  --   --   --   --   --   HGB 14.1 14.3 12.6* 11.4* 12.8* 12.7*  HCT 42.2 42.0 37.0* 34.3* 38.4* 37.3*  MCV 96.3  --   --  96.9 96.5 95.2  PLT 179  --   --  162 189 209    Basic Metabolic Panel: Recent Labs  Lab 06/27/23 1954 06/27/23 2000 06/27/23 2335 06/28/23 0028 06/29/23 0607 06/30/23 0518  NA 138 141 138 139 139 140  K 4.5 4.5 4.2 4.0 4.0 3.8  CL 105 104  --  106 110 106  CO2 24  --   --  23 21* 24  GLUCOSE 139* 137*  --  144* 139* 147*  BUN 20 26*  --  18 12 26*  CREATININE 1.40* 1.40*  --  1.09 0.93 1.02  CALCIUM  8.7*  --   --  7.5* 8.2* 8.5*  MG  --   --   --  1.7 2.0 2.2  PHOS  --   --   --  2.8 2.4* 2.9   GFR: Estimated Creatinine Clearance: 61.7 mL/min (by C-G formula based on SCr of 1.02 mg/dL). Recent Labs  Lab 06/27/23 1954 06/28/23 0028 06/29/23 0607 06/30/23 0518  WBC 11.7* 11.1* 15.0* 16.4*    Liver Function Tests: Recent Labs  Lab 06/27/23 1954  AST 19  ALT 15  ALKPHOS 61  BILITOT 0.5  PROT 6.4*  ALBUMIN 3.4*   No results for input(s): "LIPASE", "AMYLASE" in the last 168 hours. No results for input(s): "AMMONIA" in the last 168 hours.  ABG    Component Value Date/Time   PHART 7.344 (L) 06/27/2023 2335   PCO2ART 47.2 06/27/2023 2335   PO2ART 238 (H) 06/27/2023 2335   HCO3 25.7 06/27/2023 2335   TCO2 27 06/27/2023 2335   O2SAT 100 06/27/2023 2335     Coagulation Profile: Recent Labs  Lab 06/27/23 1954  INR 1.0    Cardiac Enzymes: No results for input(s): "CKTOTAL", "CKMB", "CKMBINDEX", "TROPONINI" in the last 168 hours.  HbA1C: Hgb A1c MFr Bld  Date/Time Value Ref Range Status  06/28/2023 12:28 AM 6.2 (H)  4.8 - 5.6 % Final    Comment:    (NOTE) Pre diabetes:          5.7%-6.4%  Diabetes:              >6.4%  Glycemic control for   <7.0% adults with diabetes     CBG: Recent Labs  Lab 06/29/23 1502 06/29/23 1948 06/29/23 2339 06/30/23 0354 06/30/23 0723  GLUCAP 186* 176* 148* 146* 172*    Allergies Allergies  Allergen Reactions   Indomethacin Nausea And Vomiting and Other (See Comments)    Other reaction(s): Blurred vision Ear ringing      Home Medications  Prior to Admission medications   Medication Sig Start Date End Date Taking? Authorizing Provider  albuterol  (PROVENTIL ) (2.5 MG/3ML) 0.083% nebulizer solution Take 3 mLs (2.5 mg total) by nebulization every 6 (six) hours as needed for wheezing or shortness of breath. 07/21/21   Aleck Hurdle, MD  albuterol  (VENTOLIN  HFA) 108 (90 Base) MCG/ACT inhaler INHALE 2 PUFFS BY MOUTH FOUR TIMES A DAY AS NEEDED FOR WHEEZING, SHORTNESS OF BREATHE, AND COUGH 11/23/20   [provider]  Budeson-Glycopyrrol-Formoterol (BREZTRI  AEROSPHERE) 160-9-4.8 MCG/ACT AERO Inhale 2 puffs into the lungs in the morning and at bedtime. 10/07/22   Cobb, Mariah Shines, NP  carbidopa -levodopa  (SINEMET  IR) 10-100 MG tablet Take 1 tablet by mouth 3 (three) times daily.    [provider]  celecoxib (CELEBREX) 200 MG capsule Take 1 tablet by mouth daily. 01/12/10   [provider]  ezetimibe (ZETIA) 10 MG tablet TAKE ONE-HALF TABLET BY MOUTH DAILY FOR CHOLESTEROL CONTROL 11/23/20   [provider]  gabapentin (NEURONTIN) 600 MG tablet Take by mouth.    [provider]  hydrOXYzine (ATARAX) 10 MG tablet Take by mouth.    [provider]  metoprolol succinate (TOPROL-XL) 50 MG 24 hr tablet Take by mouth.    [provider]  omeprazole (PRILOSEC) 20 MG capsule Take by mouth.    [provider]  predniSONE  (DELTASONE ) 10 MG tablet 4 tabs for 2 days, then 3 tabs for 2 days, 2 tabs for 2 days,  then 1 tab  for 2 days, then stop 10/07/22   Cobb, Mariah Shines, NP  pyridoxine (B-6) 100 MG tablet Take by mouth.    [provider]  ranitidine (ZANTAC) 300 MG capsule Take by mouth.    [provider]  rOPINIRole  (REQUIP ) 0.25 MG tablet Take by mouth.    [provider]  topiramate (TOPAMAX) 100 MG tablet Take by mouth.    [provider]           Early Glisson, MSN, AG-ACNP-BC [facility] 06/30/2023, 9:06 AM  See Amion for pager If no response to pager , please call [phone] until 7pm After 7:00 pm call Elink  [phone]

## 2023-06-30 NOTE — Progress Notes (Addendum)
 STROKE TEAM PROGRESS NOTE   SIGNIFICANT EVENTS 5/6 - TNK and mechanical thrombectomy  5/7- Extubated- lots of secretions  INTERIM HISTORY/SUBJECTIVE Patient remains aphasic but is able to answer to his name and follow simple midline and one-step commands.  He has remained hemodynamically stable with Tmax of 99.4, leukocytosis with WBC of 16.4.  Sputum culture with gram-positive rods, gram-positive cocci and gram-negative rods.  Patient is on Rocephin  for aspiration pneumonia. OBJECTIVE  CBC    Component Value Date/Time   WBC 16.4 (H) 06/30/2023 0518   RBC 3.92 (L) 06/30/2023 0518   HGB 12.7 (L) 06/30/2023 0518   HCT 37.3 (L) 06/30/2023 0518   PLT 209 06/30/2023 0518   MCV 95.2 06/30/2023 0518   MCH 32.4 06/30/2023 0518   MCHC 34.0 06/30/2023 0518   RDW 13.0 06/30/2023 0518   LYMPHSABS 0.7 06/27/2023 1954   MONOABS 0.8 06/27/2023 1954   EOSABS 0.2 06/27/2023 1954   BASOSABS 0.1 06/27/2023 1954    BMET    Component Value Date/Time   NA 140 06/30/2023 0518   K 3.8 06/30/2023 0518   CL 106 06/30/2023 0518   CO2 24 06/30/2023 0518   GLUCOSE 147 (H) 06/30/2023 0518   BUN 26 (H) 06/30/2023 0518   CREATININE 1.02 06/30/2023 0518   CALCIUM  8.5 (L) 06/30/2023 0518   GFRNONAA >60 06/30/2023 0518    IMAGING past 24 hours No results found.   Vitals:   06/30/23 0900 06/30/23 1000 06/30/23 1100 06/30/23 1200  BP: (!) 158/78 (!) 149/82 135/82 138/84  Pulse: (!) 104 (!) 106 90 94  Resp: (!) 23 20 (!) 27 (!) 21  Temp:    99.2 F (37.3 C)  TempSrc:    Axillary  SpO2: 96% 94% 93% 94%  Weight:      Height:         PHYSICAL EXAM General:  Alert, well-nourished, well-developed patient in no acute distress Psych:  Mood and affect appropriate for situation CV: Regular rate and rhythm on monitor Respiratory:  Regular, unlabored respirations on supplemental O2  NEURO:  Mental Status: Awake, eyes open, follows simple commands Speech/Language: Expressive aphasia.  Mimics, able  to state name but no other verbal output  Cranial Nerves:  II: Pupils equal round and reactive III, IV, VI: eyes midline VIII: Hearing intact to voice IX, X: Cough and gag intact EA:VWUJ midline XII: tongue is midline without fasciculations. Motor: Moves left upper and lower extremities to command, no movement of right upper extremity, withdraws right lower extremity to noxious Tone: is normal and bulk is normal Sensation-intact to light touch throughout Coordination: not tested Gait- deferred   ASSESSMENT/PLAN  Mr. Aazim Figg is a 78 y.o. male with history of hypertension, hyperlipidemia, Parkinson's disease who presents with right-sided weakness and aphasia .  He was found to have left carotid and left MCA occlusion and was given TNK.  He was taken for mechanical thrombectomy and underwent rescue left ICA stenting.  He is on Rocephin  for aspiration pneumonia.  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 Left M2 occlusion with successful suction thrombectomy   Code Stroke CT head Hyperdense left M1/M2 MCA concerning for thrombus. ASPECTS 10.   CTA head & neck  Occluded left ICA and left MCA, as detailed above. Approximately 40% stenosis of the proximal right ICA. MRI  Acute infarct affecting much of the left caudate and lentiform nuclei. Patchy acute cortically-based infarcts scattered within the frontal, parietal and temporal lobes on the left (MCA and ACA vascular territories). Additional small acute infarct along the atrium of the left lateral ventricle. Punctate acute cortical infarct within the perirolandic medial right frontoparietal lobes.  2D Echo EF 35-40%  LDL 130 HgbA1c 6.2 VTE prophylaxis - SCDs No antithrombotic prior to admission, now on aspirin  81 mg daily and Brilinta  (ticagrelor ) 90 mg  bid. Therapy recommendations:  CIR Disposition:  PRN  Respiratory insufficiency PCCM consulted for airway management Extubated 5/7 Nebs, NT suction One time dose of lasix   Ceftin for aspiration pneumonia  Hypertension Blood Pressure Goal: SBP 120-160 for first 24 hours then less than 180  IV cleviprex  if needed PRN hydralazine  and labetalol    Hyperlipidemia Home meds:  Zetia, resumed in hospital LDL 130, goal < 70 Add atorvastatin  80mg  Continue statin at discharge  Diabetes type II Controlled HgbA1c 6.2, goal < 7.0 CBGs SSI Recommend close follow-up with PCP for better DM control  Dysphagia Patient has post-stroke dysphagia, SLP consulted    Diet   Diet NPO time specified   Advance diet as tolerated  Other Stroke Risk Factors Obesity, Body mass index is 26.73 kg/m., BMI >/= 30 associated with increased stroke risk, recommend weight loss, diet and exercise as appropriate   Other Active Problems Parkinson's disease Sinemet  resumed Gabapentin, Hydroxyzine, Ropinirole , Topiramate - on hold until med rec is done  Hospital day # 3  Patient seen and examined by NP/APP with MD. MD to update note as needed.   Cortney E Bucky Cardinal , MSN, AGACNP-BC [facility] See Amion for schedule and pager information 06/30/2023 12:39 PM   I have personally obtained history,examined this patient, reviewed notes, independently viewed imaging studies, participated in medical decision making and plan of care.ROS completed by me personally and pertinent positives fully documented  I have made any additions or clarifications directly to the above note. Agree with note above.  Patient remains aphasic with hemiplegia.  Continue aspirin  and Brilinta  for stent.  Mobilize out of bed.  Therapy consults.  Continue Rocephin  for suspected aspiration pneumonia.  Will consider transfer out of ICU to neurology stepdown bed available.  No family at the bedside.  Discussed with Dr. Zaida Hertz  critical care medicine.  Greater than 50% time during this 50-minute visit was spent in counseling and coordination of care about his stroke aphasia and presumed aspiration pneumonia discussion with patient and care team and answering questions.  Ardella Beaver, MD Medical Director [facility] Pager: [phone] 06/30/2023 3:08 PM    To contact Stroke Continuity provider, please refer to [email or web address]. After hours, contact General Neurology

## 2023-06-30 NOTE — Progress Notes (Signed)
 Pt and all belongings transported to 3W 16 on telemetry without incident. Report given to RN opportunity provided for questions. Family at bedside.

## 2023-06-30 NOTE — Progress Notes (Signed)
 Patient received from 4N, alert and oriented to himself, follows some commands, family at bedside, vital signs stable, tele box connected, tube feeding running, will continue to monitor.

## 2023-07-01 DIAGNOSIS — I69391 Dysphagia following cerebral infarction: Secondary | ICD-10-CM | POA: Diagnosis not present

## 2023-07-01 DIAGNOSIS — Z794 Long term (current) use of insulin: Secondary | ICD-10-CM

## 2023-07-01 DIAGNOSIS — I63522 Cerebral infarction due to unspecified occlusion or stenosis of left anterior cerebral artery: Secondary | ICD-10-CM

## 2023-07-01 DIAGNOSIS — I6522 Occlusion and stenosis of left carotid artery: Secondary | ICD-10-CM | POA: Diagnosis not present

## 2023-07-01 DIAGNOSIS — I63512 Cerebral infarction due to unspecified occlusion or stenosis of left middle cerebral artery: Secondary | ICD-10-CM | POA: Diagnosis not present

## 2023-07-01 LAB — PHOSPHORUS: Phosphorus: 3.6 mg/dL (ref 2.5–4.6)

## 2023-07-01 LAB — GLUCOSE, CAPILLARY
Glucose-Capillary: 155 mg/dL — ABNORMAL HIGH (ref 70–99)
Glucose-Capillary: 170 mg/dL — ABNORMAL HIGH (ref 70–99)
Glucose-Capillary: 172 mg/dL — ABNORMAL HIGH (ref 70–99)
Glucose-Capillary: 172 mg/dL — ABNORMAL HIGH (ref 70–99)
Glucose-Capillary: 178 mg/dL — ABNORMAL HIGH (ref 70–99)
Glucose-Capillary: 217 mg/dL — ABNORMAL HIGH (ref 70–99)
Glucose-Capillary: 228 mg/dL — ABNORMAL HIGH (ref 70–99)

## 2023-07-01 LAB — MAGNESIUM: Magnesium: 2.3 mg/dL (ref 1.7–2.4)

## 2023-07-01 LAB — CBC
HCT: 39.7 % (ref 39.0–52.0)
Hemoglobin: 13.4 g/dL (ref 13.0–17.0)
MCH: 32.8 pg (ref 26.0–34.0)
MCHC: 33.8 g/dL (ref 30.0–36.0)
MCV: 97.1 fL (ref 80.0–100.0)
Platelets: 240 10*3/uL (ref 150–400)
RBC: 4.09 MIL/uL — ABNORMAL LOW (ref 4.22–5.81)
RDW: 13.1 % (ref 11.5–15.5)
WBC: 17.1 10*3/uL — ABNORMAL HIGH (ref 4.0–10.5)
nRBC: 0 % (ref 0.0–0.2)

## 2023-07-01 LAB — BASIC METABOLIC PANEL WITH GFR
Anion gap: 13 (ref 5–15)
BUN: 46 mg/dL — ABNORMAL HIGH (ref 8–23)
CO2: 28 mmol/L (ref 22–32)
Calcium: 8.9 mg/dL (ref 8.9–10.3)
Chloride: 103 mmol/L (ref 98–111)
Creatinine, Ser: 1.01 mg/dL (ref 0.61–1.24)
GFR, Estimated: >60 mL/min (ref >60)
Glucose, Bld: 181 mg/dL — ABNORMAL HIGH (ref 70–99)
Potassium: 4.2 mmol/L (ref 3.5–5.1)
Sodium: 144 mmol/L (ref 135–145)

## 2023-07-01 MED ORDER — INSULIN GLARGINE-YFGN 100 UNIT/ML ~~LOC~~ SOLN
5.0000 [IU] | Freq: Every day | SUBCUTANEOUS | Status: DC
  Administered 2023-07-01: 5 [IU] via SUBCUTANEOUS
  Filled 2023-07-01 (×2): qty 0.05

## 2023-07-01 NOTE — Progress Notes (Signed)
 STROKE TEAM PROGRESS NOTE   SIGNIFICANT EVENTS 5/6 - TNK and mechanical thrombectomy  5/7- Extubated- lots of secretions  INTERIM HISTORY/SUBJECTIVE  Can tell me his name but dysarhtric. Otherwise unable to communicate.   Sl. Tachycardic today. Afebrile.    on Rocephin  for aspiration pneumonia. OBJECTIVE  CBC    Component Value Date/Time   WBC 17.1 (H) 07/01/2023 0754   RBC 4.09 (L) 07/01/2023 0754   HGB 13.4 07/01/2023 0754   HCT 39.7 07/01/2023 0754   PLT 240 07/01/2023 0754   MCV 97.1 07/01/2023 0754   MCH 32.8 07/01/2023 0754   MCHC 33.8 07/01/2023 0754   RDW 13.1 07/01/2023 0754   LYMPHSABS 0.7 06/27/2023 1954   MONOABS 0.8 06/27/2023 1954   EOSABS 0.2 06/27/2023 1954   BASOSABS 0.1 06/27/2023 1954    BMET    Component Value Date/Time   NA 144 07/01/2023 0754   K 4.2 07/01/2023 0754   CL 103 07/01/2023 0754   CO2 28 07/01/2023 0754   GLUCOSE 181 (H) 07/01/2023 0754   BUN 46 (H) 07/01/2023 0754   CREATININE 1.01 07/01/2023 0754   CALCIUM  8.9 07/01/2023 0754   GFRNONAA >60 07/01/2023 0754    IMAGING past 24 hours No results found.   Vitals:   07/01/23 0350 07/01/23 0736 07/01/23 1115 07/01/23 1512  BP:  123/84 (!) 157/83 (!) 168/76  Pulse:  (!) 110 (!) 108 (!) 102  Resp:  18 18 18   Temp:  99.1 F (37.3 C) 99 F (37.2 C) 99.6 F (37.6 C)  TempSrc:  Oral Oral Oral  SpO2:  95% 92% 94%  Weight: 83.7 kg     Height:         PHYSICAL EXAM General:  Alert, well-nourished, well-developed patient in no acute distress Psych:  Mood and affect appropriate for situation CV: Regular rate and rhythm on monitor Respiratory:  Regular  NEURO:  Mental Status: Awake, eyes open, follows simple commands Speech/Language: Expressive aphasia.  Mimics, able to state name but dysarthric but no other verbal output  Cranial Nerves:  II: Pupils equal round and reactive III, IV, VI: eyes midline VIII: Hearing intact to voice ZO:XWRU midline Motor: Moves left upper  and lower extremities to command, no movement of right upper extremity, withdraws right lower extremity to noxious Tone: is normal and bulk is normal Sensation-intact to light touch throughout Coordination: not tested Gait- deferred   ASSESSMENT/PLAN  [name] is a 78 y.o. male with history of hypertension, hyperlipidemia, Parkinson's disease who presents with right-sided weakness and aphasia .  He was found to have left carotid and left MCA occlusion and was given TNK.  He was taken for mechanical thrombectomy and underwent rescue left ICA stenting.  He is on Rocephin  for aspiration pneumonia.  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 Left M2 occlusion with successful suction thrombectomy   Code Stroke CT head Hyperdense left M1/M2 MCA concerning for thrombus. ASPECTS 10.   CTA head & neck  Occluded left ICA and left MCA, as detailed above. Approximately 40% stenosis of the proximal right ICA. MRI   Acute infarct affecting much of the left caudate and lentiform nuclei. Patchy acute cortically-based infarcts scattered within the frontal, parietal and temporal lobes on the left (MCA and ACA vascular territories). Additional small acute infarct along the atrium of  the left lateral ventricle. Punctate acute cortical infarct within the perirolandic medial right frontoparietal lobes.  2D Echo EF 35-40%  LDL 130 HgbA1c 6.2 VTE prophylaxis - SCDs No antithrombotic prior to admission, now on aspirin  81 mg daily and Brilinta  (ticagrelor ) 90 mg bid. Therapy recommendations:  CIR Disposition:  PRN  Respiratory insufficiency PCCM consulted for airway management Extubated 5/7 Nebs, NT suction One time dose of lasix   Ceftin for aspiration pneumonia  Hypertension Blood Pressure Goal: SBP 120-160 for first 24 hours  then less than 180  IV cleviprex  if needed PRN hydralazine  and labetalol    Hyperlipidemia Home meds:  Zetia, resumed in hospital LDL 130, goal < 70 Add atorvastatin  80mg  Continue statin at discharge  Diabetes type II Controlled HgbA1c 6.2, goal < 7.0 CBGs SSI Recommend close follow-up with PCP for better DM control Semglee 5 units added.   Dysphagia Patient has post-stroke dysphagia, SLP consulted    Diet   Diet NPO time specified   Advance diet as tolerated  Other Stroke Risk Factors Obesity, Body mass index is 26.86 kg/m., BMI >/= 30 associated with increased stroke risk, recommend weight loss, diet and exercise as appropriate   Other Active Problems Parkinson's disease Sinemet  resumed Gabapentin, Hydroxyzine, Ropinirole , Topiramate - on hold until med rec is done  Hospital day # 77  78 year old with left MCA and ACA territory infarcts on exam is oriented to name but dysarthric no other verbal output.  Left hemiplegia he will follow simple commands.  He is on aspirin  Brilinta .  On Rocephin  for aspiration pneumonia he is on Sinemet  for his Parkinson's.  He is also on Solu-Medrol  for per CCM for respiratory insufficiency for total 5 days.  Today is day 3.  WBC elevated this could be due to steroids.  Continue to monitor.  He is afebrile.  Sugar still elevated on feeding supplement we will increase insulin . Semglee 5 units ordered daily.    Total 36 minutes spent on counseling patient and coordinating care, writing notes and reviewing chart.    Rickey Vecchiarelli,MD  To contact Stroke Continuity provider, please refer to [email or web address]. After hours, contact General Neurology

## 2023-07-01 NOTE — Progress Notes (Signed)
 Inpatient Rehab Admissions Coordinator:   CIR following,I have made multiple attempts to speak with family but have been unsuccessful so far.Will continue to call to discuss.   Wandalee Gust, MS, CCC-SLP Rehab Admissions Coordinator  [phone] (celll) [phone] (office)

## 2023-07-01 NOTE — Inpatient Diabetes Management (Signed)
 Inpatient Diabetes Program Recommendations  AACE/ADA: New Consensus Statement on Inpatient Glycemic Control (2015)  Target Ranges:  Prepandial:   less than 140 mg/dL      Peak postprandial:   less than 180 mg/dL (1-2 hours)      Critically ill patients:  140 - 180 mg/dL   Lab Results  Component Value Date   GLUCAP 217 (H) 07/01/2023   HGBA1C 6.2 (H) 06/28/2023    Review of Glycemic Control  Diabetes history: DM2 Outpatient Diabetes medications: Jardiance 25 mg daily Current orders for Inpatient glycemic control: Semglee 5 at bedtime, Novolog  0-15 Q4H, Solumedrol 40 daily  HgbA1C - 6.2%  Inpatient Diabetes Program Recommendations:    Agree with orders.  Continue to follow.  Thank you. Joni Net, RD, LDN, CDCES Inpatient Diabetes Coordinator [phone]

## 2023-07-02 ENCOUNTER — Inpatient Hospital Stay (HOSPITAL_COMMUNITY)

## 2023-07-02 DIAGNOSIS — I6522 Occlusion and stenosis of left carotid artery: Secondary | ICD-10-CM | POA: Diagnosis not present

## 2023-07-02 DIAGNOSIS — I63522 Cerebral infarction due to unspecified occlusion or stenosis of left anterior cerebral artery: Secondary | ICD-10-CM | POA: Diagnosis not present

## 2023-07-02 DIAGNOSIS — I639 Cerebral infarction, unspecified: Secondary | ICD-10-CM | POA: Diagnosis not present

## 2023-07-02 DIAGNOSIS — I63512 Cerebral infarction due to unspecified occlusion or stenosis of left middle cerebral artery: Secondary | ICD-10-CM | POA: Diagnosis not present

## 2023-07-02 DIAGNOSIS — I69391 Dysphagia following cerebral infarction: Secondary | ICD-10-CM | POA: Diagnosis not present

## 2023-07-02 LAB — POCT I-STAT 7, (LYTES, BLD GAS, ICA,H+H)
Acid-Base Excess: 3 mmol/L — ABNORMAL HIGH (ref 0.0–2.0)
Bicarbonate: 29.6 mmol/L — ABNORMAL HIGH (ref 20.0–28.0)
Calcium, Ion: 1.22 mmol/L (ref 1.15–1.40)
HCT: 39 % (ref 39.0–52.0)
Hemoglobin: 13.3 g/dL (ref 13.0–17.0)
O2 Saturation: 100 %
Patient temperature: 99.8
Potassium: 5 mmol/L (ref 3.5–5.1)
Sodium: 149 mmol/L — ABNORMAL HIGH (ref 135–145)
TCO2: 31 mmol/L (ref 22–32)
pCO2 arterial: 55.4 mmHg — ABNORMAL HIGH (ref 32–48)
pH, Arterial: 7.338 — ABNORMAL LOW (ref 7.35–7.45)
pO2, Arterial: 197 mmHg — ABNORMAL HIGH (ref 83–108)

## 2023-07-02 LAB — BASIC METABOLIC PANEL WITH GFR
Anion gap: 10 (ref 5–15)
BUN: 48 mg/dL — ABNORMAL HIGH (ref 8–23)
CO2: 26 mmol/L (ref 22–32)
Calcium: 8.8 mg/dL — ABNORMAL LOW (ref 8.9–10.3)
Chloride: 110 mmol/L (ref 98–111)
Creatinine, Ser: 1.07 mg/dL (ref 0.61–1.24)
GFR, Estimated: >60 mL/min (ref >60)
Glucose, Bld: 206 mg/dL — ABNORMAL HIGH (ref 70–99)
Potassium: 4.3 mmol/L (ref 3.5–5.1)
Sodium: 146 mmol/L — ABNORMAL HIGH (ref 135–145)

## 2023-07-02 LAB — PHOSPHORUS: Phosphorus: 3.1 mg/dL (ref 2.5–4.6)

## 2023-07-02 LAB — CBC
HCT: 41.8 % (ref 39.0–52.0)
Hemoglobin: 13.8 g/dL (ref 13.0–17.0)
MCH: 32.5 pg (ref 26.0–34.0)
MCHC: 33 g/dL (ref 30.0–36.0)
MCV: 98.6 fL (ref 80.0–100.0)
Platelets: 274 10*3/uL (ref 150–400)
RBC: 4.24 MIL/uL (ref 4.22–5.81)
RDW: 13.1 % (ref 11.5–15.5)
WBC: 14.9 10*3/uL — ABNORMAL HIGH (ref 4.0–10.5)
nRBC: 0 % (ref 0.0–0.2)

## 2023-07-02 LAB — CULTURE, RESPIRATORY W GRAM STAIN

## 2023-07-02 LAB — GLUCOSE, CAPILLARY
Glucose-Capillary: 169 mg/dL — ABNORMAL HIGH (ref 70–99)
Glucose-Capillary: 194 mg/dL — ABNORMAL HIGH (ref 70–99)
Glucose-Capillary: 197 mg/dL — ABNORMAL HIGH (ref 70–99)
Glucose-Capillary: 206 mg/dL — ABNORMAL HIGH (ref 70–99)
Glucose-Capillary: 220 mg/dL — ABNORMAL HIGH (ref 70–99)
Glucose-Capillary: 244 mg/dL — ABNORMAL HIGH (ref 70–99)

## 2023-07-02 LAB — MAGNESIUM: Magnesium: 2.5 mg/dL — ABNORMAL HIGH (ref 1.7–2.4)

## 2023-07-02 MED ORDER — FENTANYL CITRATE PF 50 MCG/ML IJ SOSY
25.0000 ug | PREFILLED_SYRINGE | INTRAMUSCULAR | Status: DC | PRN
  Administered 2023-07-05 – 2023-07-08 (×4): 25 ug via INTRAVENOUS
  Filled 2023-07-02: qty 1

## 2023-07-02 MED ORDER — FENTANYL 2500MCG IN NS 250ML (10MCG/ML) PREMIX INFUSION
0.0000 ug/h | INTRAVENOUS | Status: DC
  Administered 2023-07-02: 25 ug/h via INTRAVENOUS
  Administered 2023-07-05 – 2023-07-07 (×2): 50 ug/h via INTRAVENOUS
  Administered 2023-07-07: 100 ug/h via INTRAVENOUS
  Filled 2023-07-02 (×3): qty 250

## 2023-07-02 MED ORDER — PHENYLEPHRINE 80 MCG/ML (10ML) SYRINGE FOR IV PUSH (FOR BLOOD PRESSURE SUPPORT): 400.0000 ug | PREFILLED_SYRINGE | Freq: Once | INTRAVENOUS | Status: AC | PRN

## 2023-07-02 MED ORDER — LACTATED RINGERS IV BOLUS
500.0000 mL | Freq: Once | INTRAVENOUS | Status: AC
  Administered 2023-07-02: 500 mL via INTRAVENOUS

## 2023-07-02 MED ORDER — ORAL CARE MOUTH RINSE: 15.0000 mL | OROMUCOSAL | Status: DC | PRN

## 2023-07-02 MED ORDER — ETOMIDATE 2 MG/ML IV SOLN
20.0000 mg | Freq: Once | INTRAVENOUS | Status: AC
  Administered 2023-07-02: 20 mg via INTRAVENOUS

## 2023-07-02 MED ORDER — MIDAZOLAM HCL 2 MG/2ML IJ SOLN
2.0000 mg | Freq: Once | INTRAMUSCULAR | Status: AC
  Administered 2023-07-02: 2 mg via INTRAVENOUS

## 2023-07-02 MED ORDER — AMLODIPINE BESYLATE 10 MG PO TABS
10.0000 mg | ORAL_TABLET | Freq: Every day | ORAL | Status: DC
  Administered 2023-07-02: 10 mg via ORAL
  Filled 2023-07-02: qty 2

## 2023-07-02 MED ORDER — INSULIN GLARGINE-YFGN 100 UNIT/ML ~~LOC~~ SOLN
7.0000 [IU] | Freq: Two times a day (BID) | SUBCUTANEOUS | Status: DC
  Administered 2023-07-02 – 2023-07-08 (×12): 7 [IU] via SUBCUTANEOUS
  Filled 2023-07-02 (×15): qty 0.07

## 2023-07-02 MED ORDER — PHENYLEPHRINE 80 MCG/ML (10ML) SYRINGE FOR IV PUSH (FOR BLOOD PRESSURE SUPPORT): 80.0000 ug | PREFILLED_SYRINGE | Freq: Once | INTRAVENOUS | Status: DC | PRN

## 2023-07-02 MED ORDER — PHENYLEPHRINE 80 MCG/ML (10ML) SYRINGE FOR IV PUSH (FOR BLOOD PRESSURE SUPPORT)
PREFILLED_SYRINGE | INTRAVENOUS | Status: AC
  Administered 2023-07-02: 400 ug
  Filled 2023-07-02: qty 10

## 2023-07-02 MED ORDER — ORAL CARE MOUTH RINSE
15.0000 mL | OROMUCOSAL | Status: DC
  Administered 2023-07-02 (×2): 15 mL via OROMUCOSAL

## 2023-07-02 MED ORDER — DEXMEDETOMIDINE HCL IN NACL 400 MCG/100ML IV SOLN
0.0000 ug/kg/h | INTRAVENOUS | Status: DC
Start: 2023-07-03 — End: 2023-07-06
  Administered 2023-07-02: 0.4 ug/kg/h via INTRAVENOUS
  Administered 2023-07-03: 0.2 ug/kg/h via INTRAVENOUS
  Administered 2023-07-03 – 2023-07-04 (×3): 0.6 ug/kg/h via INTRAVENOUS
  Administered 2023-07-04: 0.5 ug/kg/h via INTRAVENOUS
  Administered 2023-07-05 – 2023-07-06 (×4): 0.7 ug/kg/h via INTRAVENOUS
  Administered 2023-07-06: 0.4 ug/kg/h via INTRAVENOUS
  Filled 2023-07-02 (×3): qty 100
  Filled 2023-07-02: qty 200
  Filled 2023-07-02 (×2): qty 100
  Filled 2023-07-02 (×2): qty 200
  Filled 2023-07-02: qty 100

## 2023-07-02 MED ORDER — PHENYLEPHRINE 80 MCG/ML (10ML) SYRINGE FOR IV PUSH (FOR BLOOD PRESSURE SUPPORT)
80.0000 ug | PREFILLED_SYRINGE | Freq: Once | INTRAVENOUS | Status: AC | PRN
  Administered 2023-07-02: 160 ug via INTRAVENOUS

## 2023-07-02 MED ORDER — FENTANYL CITRATE PF 50 MCG/ML IJ SOSY
50.0000 ug | PREFILLED_SYRINGE | Freq: Once | INTRAMUSCULAR | Status: AC
  Administered 2023-07-02: 50 ug via INTRAVENOUS

## 2023-07-02 NOTE — Progress Notes (Signed)
 I was called because the patient was having increasing work of breathing.  At the beginning of the shift, the patient was on 2 L and satting well, but over the course of the shift has had increasing oxygen requirements and is currently satting in the low 90s on nonrebreather.  He appears to be doing significant work to continue breathing.  On auscultation, he has crackles in the left lower aspect of his lungs, my suspicion is that he may have had some degree of aspiration.  He has spiked a fever to 102.  He is more confused than he was, no longer following commands.  If he continues with his work of breathing, I worry that we may need to support him overnight.  Discussed with his wife who confirms he is full code.  I will ask PCCM to evaluate him for supportive measures on the floor versus transfer for elective intubation.  Ann Keto, MD [facility]   If 7pm- 7am, please page neurology on call as listed in AMION.

## 2023-07-02 NOTE — Progress Notes (Signed)
 Inpatient Rehab Admissions Coordinator:    I spoke with pt.'s wife to discuss potential CIR admit.She is interested and can provide 24/7 min Assist. I discussed that we would need to see pt able to tolerate/participate a little more with therapies.  Wandalee Gust, MS, CCC-SLP Rehab Admissions Coordinator  [phone] (celll) [phone] (office)

## 2023-07-02 NOTE — Procedures (Signed)
 Intubation Procedure Note  Deonne Corniel  [id number]  [birth year]  Date:07/02/23  Time:11:23 PM   Provider Performing:Yanira Tolsma Charon Copper    Procedure: Intubation (31500)  Indication(s) Respiratory Failure  Consent Unable to obtain consent due to emergent nature of procedure.   Anesthesia Etomidate, Versed, and see mar   Time Out Verified patient identification, verified procedure, site/side was marked, verified correct patient position, special equipment/implants available, medications/allergies/relevant history reviewed, required imaging and test results available.   Sterile Technique Usual hand hygeine, masks, and gloves were used   Procedure Description Patient positioned in bed supine.  Sedation given as noted above.  Patient was intubated with endotracheal tube using Glidescope.  View was Grade 1 full glottis .  Number of attempts was 1.  Colorimetric CO2 detector was consistent with tracheal placement.   Complications/Tolerance None; patient tolerated the procedure well. Chest X-ray is ordered to verify placement.   EBL Minimal   Specimen(s) None

## 2023-07-02 NOTE — Progress Notes (Signed)
 STROKE TEAM PROGRESS NOTE   SIGNIFICANT EVENTS 5/6 - TNK and mechanical thrombectomy  5/7- Extubated- lots of secretions  INTERIM HISTORY/SUBJECTIVE  Can tell me his name but dysarhtric.  Otherwise unable to communicate.   Sl. Tachycardic today. Afebrile.   on Rocephin  for aspiration pneumonia.   OBJECTIVE  CBC    Component Value Date/Time   WBC 14.9 (H) 07/02/2023 0716   RBC 4.24 07/02/2023 0716   HGB 13.8 07/02/2023 0716   HCT 41.8 07/02/2023 0716   PLT 274 07/02/2023 0716   MCV 98.6 07/02/2023 0716   MCH 32.5 07/02/2023 0716   MCHC 33.0 07/02/2023 0716   RDW 13.1 07/02/2023 0716   LYMPHSABS 0.7 06/27/2023 1954   MONOABS 0.8 06/27/2023 1954   EOSABS 0.2 06/27/2023 1954   BASOSABS 0.1 06/27/2023 1954    BMET    Component Value Date/Time   NA 146 (H) 07/02/2023 0716   K 4.3 07/02/2023 0716   CL 110 07/02/2023 0716   CO2 26 07/02/2023 0716   GLUCOSE 206 (H) 07/02/2023 0716   BUN 48 (H) 07/02/2023 0716   CREATININE 1.07 07/02/2023 0716   CALCIUM  8.8 (L) 07/02/2023 0716   GFRNONAA >60 07/02/2023 0716    IMAGING past 24 hours No results found.   Vitals:   07/02/23 0533 07/02/23 0542 07/02/23 0612 07/02/23 0756  BP: (!) 196/114 (!) 206/111 (!) 157/72 (!) 146/82  Pulse: 97 97 98 (!) 101  Resp:   16 19  Temp:   99.3 F (37.4 C) 98.3 F (36.8 C)  TempSrc:    Oral  SpO2:    94%  Weight:      Height:         PHYSICAL EXAM General:  Alert, well-nourished, well-developed patient in no acute distress Psych:  Mood and affect appropriate for situation CV: Regular rate and rhythm on monitor Respiratory:  Regular  NEURO:  Mental Status: Awake, eyes open, follows simple commands Speech/Language: Expressive aphasia.  Mimics, able to state name but dysarthric but no other verbal output  Cranial Nerves:  II: Pupils equal round and reactive III, IV, VI: eyes midline VIII: Hearing intact to voice ZO:XWRU midline Motor: Moves left upper and lower  extremities to command, no movement of right upper extremity, withdraws right lower extremity to noxious Tone: is normal and bulk is normal Sensation-intact to light touch throughout Coordination: not tested Gait- deferred   ASSESSMENT/PLAN  Mr. Aareon Drass is a 78 y.o. male with history of hypertension, hyperlipidemia, Parkinson's disease who presents with right-sided weakness and aphasia .  He was found to have left carotid and left MCA occlusion and was given TNK.  He was taken for mechanical thrombectomy and underwent rescue left ICA stenting.  He is on Rocephin  for aspiration pneumonia.  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 Left M2 occlusion with successful suction thrombectomy   Code Stroke CT head Hyperdense left M1/M2 MCA concerning for thrombus. ASPECTS 10.   CTA head & neck  Occluded left ICA and left MCA, as detailed above. Approximately 40% stenosis of the proximal right ICA. MRI   Acute infarct affecting much of the left caudate and lentiform nuclei. Patchy acute cortically-based infarcts scattered within the frontal, parietal and temporal lobes on the left (MCA and ACA vascular territories). Additional small acute infarct along the atrium of the  left lateral ventricle. Punctate acute cortical infarct within the perirolandic medial right frontoparietal lobes.  2D Echo EF 35-40%  LDL 130 HgbA1c 6.2 VTE prophylaxis - SCDs No antithrombotic prior to admission, now on aspirin  81 mg daily and Brilinta  (ticagrelor ) 90 mg bid. Therapy recommendations:  CIR Disposition:  PRN  Respiratory insufficiency PCCM consulted for airway management Extubated 5/7 Nebs, NT suction One time dose of lasix   Ceftriaxone   for aspiration pneumonia  Hypertension Blood Pressure Goal: SBP 120-160 for first 24 hours then  less than 180  S. Tachycardic-monitor for now. Still elevated. Add norvasc today.  Hyperlipidemia Home meds:  Zetia, resumed in hospital LDL 130, goal < 70 Con't atorvastatin  80mg  Continue statin at discharge  Diabetes type II Controlled HgbA1c 6.2, goal < 7.0 CBGs SSI Recommend close follow-up with PCP for better DM control Increase Semglee 7 units BID. DM coordinator consulted.   Dysphagia Patient has post-stroke dysphagia, SLP consulted    Diet   Diet NPO time specified   Advance diet as tolerated  Other Stroke Risk Factors Obesity, Body mass index is 27.73 kg/m., BMI >/= 30 associated with increased stroke risk, recommend weight loss, diet and exercise as appropriate   Other Active Problems Parkinson's disease Sinemet  resumed Gabapentin, Hydroxyzine, Ropinirole , Topiramate - on hold until med rec is done  Hospital day # 5  BP elevated. Norvasc added. on Solu-Medrol  for per CCM for respiratory insufficiency for total 5 days.  Today is day 4.  WBC elevated this could be due to steroids.  Continue to monitor.  He is afebrile. Semglee increased and DM coordinator consulted.   Total 36 minutes spent on counseling patient and coordinating care, writing notes and reviewing chart.    Tsugio Elison,MD  To contact Stroke Continuity provider, please refer to [email or web address]. After hours, contact General Neurology

## 2023-07-02 NOTE — Progress Notes (Signed)
 Cross covering ICU physician  Called to bedside for increased wob, transferred to ICU 2/2 inability to tolerate NIV 2/2 mental status. Emergent intubation done.   Updated wife via phone. All questions answered to best of my ability.

## 2023-07-02 NOTE — PMR Pre-admission (Shared)
 PMR Admission Coordinator Pre-Admission Assessment  Patient: Rickey Baker is an 78 y.o., male MRN: [id number] DOB: [birth year] Height: 5' 9.5" (176.5 cm) Weight: 86.4 kg  Insurance Information HMO:     PPO:      PCP:      IPA:      80/20:      OTHER:  PRIMARY: [employer]       Policy#: ***      Subscriber: *** CM Name: ***      Phone#: ***     Fax#: *** Pre-Cert#: ***      Employer: *** Benefits:  Phone #: ***     Name: *** Eff. Date: ***     Deduct: ***      Out of Pocket Max: ***      Life Max: *** CIR: ***      SNF: *** Outpatient: ***     Co-Pay: *** Home Health: ***      Co-Pay: *** DME: ***     Co-Pay: *** Providers: *** SECONDARY:       Policy#:      Phone#:   Financial Counselor:       Phone#:   The "Data Collection Information Summary" for patients in Inpatient Rehabilitation Facilities with attached "Privacy Act Statement-Health Care Records" was provided and verbally reviewed with: Patient  Emergency Contact Information Contact Information     Name Relation Home Work Mobile   [place] Spouse   [phone]      Other Contacts     Name Relation Home Work [place] Daughter   [phone]   [facility] Son   [phone]       Current Medical History  Patient Admitting Diagnosis: CVA History of Present Illness: Rickey Baker is a 78 y.o. male with a history of hypertension, hyperlipidemia, Parkinson's disease who presented to [facility] ED 06/27/23  with right-sided weakness and aphasia. He was Found with large ICA/MCA occlusion on CTA. Pt. Received TNK and underwent  left M2 thrombectomy, L ICA stent placement with angioplasty 06/27/23.Just prior to the procedure he apparently had a vomiting episode requiring suctioning therefore, following the procedure, he remained intubated and was transferred to the ICU PCCM was subsequently called in consultation for vent management  He was extubated 5/7.  MRI of Pt.'s  brain  06/28/23 showed Acute infarct affecting much of the left caudate and lentiform nuclei.  Patchy acute cortically-based infarcts scattered within the frontal, parietal and temporal lobes on the left (MCA and ACA vascular territories). Additional small acute infarct along the atrium of the left lateral ventricle.  Punctate acute cortical infarct within the perirolandic medial right frontoparietal lobes.  Chronic lacunar infarct within the right corona radiata/basal ganglia.  Background moderate chronic small vessel ischemic changes within the cerebral white matter and pons. Paranasal sinus disease as described. [facility] 06/28/23 with acute left MCA territory infarcts, similar when comparing across modalities. No evidence of mass occupying acute hemorrhage or progressive mass effect. Pt. Seen by PT/OT/SLP and they recommend CIR to assist return to PLOF.  Complete NIHSS TOTAL: 21  Patient's medical record from [facility]  has been reviewed by the rehabilitation admission coordinator and physician.  Past Medical History  Past Medical History:  Diagnosis Date   Arthritis    COPD (chronic obstructive pulmonary disease) (HCC)    Hyperlipidemia    Hypertension    Parkinson's disease (HCC)    PTSD (post-traumatic stress disorder)  Has the patient had major surgery during 100 days prior to admission? Yes  Family History   family history includes Asthma in his son.  Current Medications  Current Facility-Administered Medications:     stroke: early stages of recovery book, , Does not apply, Once, Augustin Leber, MD   acetaminophen  (TYLENOL ) tablet 650 mg, 650 mg, Oral, Q4H PRN, 650 mg at 07/02/23 0820 **OR** acetaminophen  (TYLENOL ) 160 MG/5ML solution 650 mg, 650 mg, Per Tube, Q4H PRN **OR** acetaminophen  (TYLENOL ) suppository 650 mg, 650 mg, Rectal, Q4H PRN, Augustin Leber, MD   albuterol  (PROVENTIL ) (2.5 MG/3ML) 0.083% nebulizer solution 2.5 mg, 2.5 mg, Nebulization, Q3H  PRN, Desai, Rahul P, PA-C, 2.5 mg at 07/02/23 0618   arformoterol  (BROVANA ) nebulizer solution 15 mcg, 15 mcg, Nebulization, BID, Desai, Rahul P, PA-C, 15 mcg at 07/02/23 4403   aspirin  chewable tablet 81 mg, 81 mg, Per Tube, Daily, Lisabeth Rider, MD, 81 mg at 07/02/23 0820   atorvastatin  (LIPITOR) tablet 40 mg, 40 mg, Per Tube, Daily, Dela Favor, Sim Dryer, NP, 40 mg at 07/02/23 0820   budesonide  (PULMICORT ) nebulizer solution 0.5 mg, 0.5 mg, Nebulization, BID, Desai, Rahul P, PA-C, 0.5 mg at 07/02/23 4742   carbidopa -levodopa  (SINEMET  IR) 10-100 MG per tablet immediate release 1.5 tablet, 1.5 tablet, Per Tube, TID, Dione Franks, Rahul P, PA-C, 1.5 tablet at 07/02/23 0552   cefTRIAXone  (ROCEPHIN ) 2 g in sodium chloride  0.9 % 100 mL IVPB, 2 g, Intravenous, Q24H, Simpson, Paula B, NP, Last Rate: 200 mL/hr at 07/02/23 0823, 2 g at 07/02/23 5956   Chlorhexidine  Gluconate Cloth 2 % PADS 6 each, 6 each, Topical, Daily, Augustin Leber, MD, 6 each at 07/02/23 0825   enoxaparin  (LOVENOX ) injection 40 mg, 40 mg, Subcutaneous, Daily, Janett Medin, Pramod S, MD, 40 mg at 07/02/23 0825   feeding supplement (OSMOLITE 1.5 CAL) liquid 1,000 mL, 1,000 mL, Per Tube, Continuous, Chand, Sudham, MD, Last Rate: 50 mL/hr at 07/02/23 0534, 1,000 mL at 07/02/23 0534   feeding supplement (PROSource TF20) liquid 60 mL, 60 mL, Per Tube, TID, Chand, Sudham, MD, 60 mL at 07/02/23 0824   hydrALAZINE  (APRESOLINE ) injection 10-20 mg, 10-20 mg, Intravenous, Q4H PRN, Sethi, Pramod S, MD, 20 mg at 07/01/23 0411   insulin  aspart (novoLOG ) injection 0-15 Units, 0-15 Units, Subcutaneous, Q4H, Desai, Rahul P, PA-C, 3 Units at 07/02/23 3875   insulin  glargine-yfgn (SEMGLEE) injection 5 Units, 5 Units, Subcutaneous, QHS, Palikh, Gaurang M, MD, 5 Units at 07/01/23 2112   labetalol  (NORMODYNE ) injection 10-20 mg, 10-20 mg, Intravenous, Q2H PRN, Janett Medin, Pramod S, MD, 20 mg at 07/01/23 2341   losartan (COZAAR) tablet 25 mg, 25 mg, Per Tube, Daily, Hardie Leyland B, NP, 25 mg at 07/02/23 0820   methylPREDNISolone  sodium succinate (SOLU-MEDROL ) 40 mg/mL injection 40 mg, 40 mg, Intravenous, Daily, Hardie Leyland B, NP, 40 mg at 07/02/23 0824   multivitamin with minerals tablet 1 tablet, 1 tablet, Per Tube, Daily, Trevor Fudge, MD, 1 tablet at 07/02/23 0820   Oral care mouth rinse, 15 mL, Mouth Rinse, 4 times per day, Palikh, Arlo Berber, MD   Oral care mouth rinse, 15 mL, Mouth Rinse, PRN, Palikh, Arlo Berber, MD   pantoprazole  (PROTONIX ) injection 40 mg, 40 mg, Intravenous, QHS, Augustin Leber, MD, 40 mg at 07/01/23 2111   polyethylene glycol (MIRALAX  / GLYCOLAX ) packet 17 g, 17 g, Per Tube, Daily, Desai, Rahul P, PA-C, 17 g at 07/02/23 0824   revefenacin  (YUPELRI ) nebulizer solution 175 mcg, 175 mcg,  Nebulization, Daily, Desai, Rahul P, PA-C, 175 mcg at 07/02/23 0843   senna-docusate (Senokot-S) tablet 1 tablet, 1 tablet, Per Tube, BID, Hardie Leyland B, NP, 1 tablet at 07/02/23 0826   thiamine  (VITAMIN B1) tablet 100 mg, 100 mg, Per Tube, Daily, Chand, Sudham, MD, 100 mg at 07/02/23 0820   ticagrelor  (BRILINTA ) tablet 90 mg, 90 mg, Per Tube, BID, Sethi, Pramod S, MD, 90 mg at 07/02/23 0820  Patients Current Diet:  Diet Order             Diet NPO time specified  Diet effective now                   Precautions / Restrictions Precautions Precautions: Fall Precaution/Restrictions Comments: cortrak, R hemi Restrictions Weight Bearing Restrictions Per Provider Order: No   Has the patient had 2 or more falls or a fall with injury in the past year? Yes  Prior Activity Level Community (5-7x/wk): Pt. active in the community PTA  Prior Functional Level Self Care: Did the patient need help bathing, dressing, using the toilet or eating? Independent  Indoor Mobility: Did the patient need assistance with walking from room to room (with or without device)? Independent  Stairs: Did the patient need assistance with internal or external  stairs (with or without device)? Independent  Functional Cognition: Did the patient need help planning regular tasks such as shopping or remembering to take medications? Dependent  Patient Information Are you of Hispanic, Latino/a,or Spanish origin?: A. No, not of Hispanic, Latino/a, or Spanish origin (proxy) What is your race?: A. White Do you need or want an interpreter to communicate with a doctor or health care staff?: 0. No  Patient's Response To:  Health Literacy and Transportation Is the patient able to respond to health literacy and transportation needs?: No Health Literacy - How often do you need to have someone help you when you read instructions, pamphlets, or other written material from your doctor or pharmacy?: Patient unable to respond In the past 12 months, has lack of transportation kept you from medical appointments or from getting medications?: No (proxy) In the past 12 months, has lack of transportation kept you from meetings, work, or from getting things needed for daily living?: No  Journalist, newspaper / Equipment    Prior Device Use: Indicate devices/aids used by the patient prior to current illness, exacerbation or injury? Walker  Current Functional Level Cognition  Arousal/Alertness: Awake/alert Overall Cognitive Status: Impaired/Different from baseline Orientation Level: Oriented to person Attention: Focused, Sustained Focused Attention: Appears intact Sustained Attention: Impaired Sustained Attention Impairment: Verbal basic, Functional basic    Extremity Assessment (includes Sensation/Coordination)  Upper Extremity Assessment: Defer to OT evaluation RUE Deficits / Details: difficult to assess due to impaired commuincation but noted flicker of movement in hand and attempts to raise UE minimally.  grossly flaccid with no functional use. no response to noxious stimuli. RUE Sensation: decreased light touch, decreased proprioception RUE Coordination:  decreased fine motor, decreased gross motor  Lower Extremity Assessment: RLE deficits/detail, LLE deficits/detail RLE Deficits / Details: Withdraws to pain LLE Deficits / Details: Moves spontaneously    ADLs  Overall ADL's : Needs assistance/impaired Grooming: Moderate assistance, Sitting (chair position) Grooming Details (indicate cue type and reason): L hand to wipe eyes, assist to initate but able to complete task after Upper Body Bathing: Maximal assistance, Sitting Lower Body Bathing: Total assistance, Bed level Upper Body Dressing : Sitting, Total assistance Lower Body Dressing: Total assistance, +2 for  physical assistance, Bed level Toilet Transfer Details (indicate cue type and reason): deferred Functional mobility during ADLs: Maximal assistance, +2 for physical assistance    Mobility  Overal bed mobility: Needs Assistance Bed Mobility: Supine to Sit Supine to sit: Max assist, +2 for physical assistance Sit to supine: Total assist, +2 for physical assistance General bed mobility comments: Pt initiating bringing LLE off edge of bed with cueing, assist for R sided management    Transfers  Overall transfer level: Needs assistance Equipment used: Ambulation equipment used Transfers: Sit to/from Stand Sit to Stand: Max assist, +2 physical assistance Transfer via Lift Equipment: Stedy General transfer comment: Stedy to transfer from bed to chair, pt pulling up with L side; unable to achieve upright standing    Ambulation / Gait / Stairs / Wheelchair Mobility       Posture / Balance Dynamic Sitting Balance Sitting balance - Comments: Posterior lean, requiring min-modA Balance Overall balance assessment: Needs assistance Sitting-balance support: No upper extremity supported, Feet supported, Single extremity supported Sitting balance-Leahy Scale: Poor Sitting balance - Comments: Posterior lean, requiring min-modA Postural control: Posterior lean, Right lateral lean Standing  balance support: Bilateral upper extremity supported, During functional activity Standing balance-Leahy Scale: Zero    Special needs/care consideration Special service needs ***   Previous Home Environment (from acute therapy documentation) Living Arrangements: Spouse/significant other  Lives With: Spouse Available Help at Discharge: Family, Other (Comment) Type of Home: Apartment Home Layout: One level Home Access: Level entry Bathroom Shower/Tub: Engineer, manufacturing systems: Standard Bathroom Accessibility: No Home Care Services: No Additional Comments: per chart home with spouse but pt unable to provide home setup  Discharge Living Setting Plans for Discharge Living Setting: Patient's home, Apartment Type of Home at Discharge: Apartment Discharge Home Layout: One level Discharge Home Access: Level entry Discharge Bathroom Shower/Tub: Tub/shower unit Discharge Bathroom Toilet: Standard Discharge Bathroom Accessibility: No Does the patient have any problems obtaining your medications?: No  Social/Family/Support Systems Patient Roles: Spouse Contact Information: [phone] Anticipated Caregiver: 24/7 Anticipated Caregiver's Contact Information: Min A Caregiver Availability: 24/7 Discharge Plan Discussed with Primary Caregiver: Yes Is Caregiver In Agreement with Plan?: Yes Does Caregiver/Family have Issues with Lodging/Transportation while Pt is in Rehab?: No  Goals Patient/Family Goal for Rehab: PT/OT/SLP Min A Expected length of stay: 18-21 days Pt/Family Agrees to Admission and willing to participate: Yes Program Orientation Provided & Reviewed with Pt/Caregiver Including Roles  & Responsibilities: Yes  Decrease burden of Care through IP rehab admission: Not anticipated  Possible need for SNF placement upon discharge: not anticipated  Patient Condition: {PATIENT'S CONDITION:22832}  Preadmission Screen Completed By:  Dorena Gander, 07/02/2023 11:00  AM ______________________________________________________________________   Discussed status with Dr. Aaron Aas on *** at *** and received approval for admission today.  Admission Coordinator:  Dorena Gander, CCC-SLP, time Aaron AasAlanna Hu ***   Assessment/Plan: Diagnosis: *** Does the need for close, 24 hr/day Medical supervision in concert with the patient's rehab needs make it unreasonable for this patient to be served in a less intensive setting? {yes_no_potentially:3041433} Co-Morbidities requiring supervision/potential complications: *** Due to {due IO:9629528}, does the patient require 24 hr/day rehab nursing? {yes_no_potentially:3041433} Does the patient require coordinated care of a physician, rehab nurse, PT, OT, and SLP to address physical and functional deficits in the context of the above medical diagnosis(es)? {yes_no_potentially:3041433} Addressing deficits in the following areas: {deficits:3041436} Can the patient actively participate in an intensive therapy program of at least 3 hrs of therapy 5 days a week? {yes_no_potentially:3041433}  The potential for patient to make measurable gains while on inpatient rehab is {potential:3041437} Anticipated functional outcomes upon discharge from inpatient rehab: {functional outcomes:304600100} PT, {functional outcomes:304600100} OT, {functional outcomes:304600100} SLP Estimated rehab length of stay to reach the above functional goals is: *** Anticipated discharge destination: {anticipated dc setting:21604} 10. Overall Rehab/Functional Prognosis: {potential:3041437}   MD Signature: ***

## 2023-07-02 NOTE — Significant Event (Signed)
 Rapid Response Event Note   Reason for Call : Respiratory distress  Initial Focused Assessment:  S/P Left M2 thrombectomy and L ICA stent, probable aspiration, extubated 5/7:  I was notified by nursing staff of pt with tachypnea and decreased oxygen saturations to the 80s while on 3L [place]. Pt placed on NRB mask at 15L. NTS per RRT without significant results.  Pt alert, aphasic. Labored respirations with accessory muscle use. Skin pink, hot and diaphoretic. BBS Rhonchi. Dr. Alecia Ames at bedside. PCCM reconsulted for respiratory failure/intubation.  -2215-102.3 R, HR 98, 176/99, RR 32 with sats 94% on NRB mask at 15L  2243-102.3R, HR 91, 131/78 (92), RR 35 with sats 97% on 15L NRB mask   Interventions:  -NRB mask -PCXR -PIV -Tx 4N17         MD Notified: Dr. Alecia Ames at bedside Call Time: 2154 Arrival Time: 2200 End Time: 2345  Dodson Freestone, RN

## 2023-07-02 NOTE — Plan of Care (Signed)
  Problem: Education: Goal: Knowledge of disease or condition will improve Outcome: Progressing Goal: Knowledge of secondary prevention will improve (MUST DOCUMENT ALL) Outcome: Progressing Goal: Knowledge of patient specific risk factors will improve (DELETE if not current risk factor) Outcome: Progressing   Problem: Ischemic Stroke/TIA Tissue Perfusion: Goal: Complications of ischemic stroke/TIA will be minimized Outcome: Progressing   Problem: Coping: Goal: Will verbalize positive feelings about self Outcome: Progressing   Problem: Health Behavior/Discharge Planning: Goal: Ability to manage health-related needs will improve Outcome: Progressing

## 2023-07-03 ENCOUNTER — Inpatient Hospital Stay (HOSPITAL_COMMUNITY)

## 2023-07-03 ENCOUNTER — Encounter (HOSPITAL_COMMUNITY): Payer: Self-pay | Admitting: Neurology

## 2023-07-03 ENCOUNTER — Other Ambulatory Visit: Payer: Self-pay

## 2023-07-03 DIAGNOSIS — J9690 Respiratory failure, unspecified, unspecified whether with hypoxia or hypercapnia: Secondary | ICD-10-CM

## 2023-07-03 DIAGNOSIS — I639 Cerebral infarction, unspecified: Secondary | ICD-10-CM | POA: Diagnosis not present

## 2023-07-03 DIAGNOSIS — E870 Hyperosmolality and hypernatremia: Secondary | ICD-10-CM

## 2023-07-03 DIAGNOSIS — I779 Disorder of arteries and arterioles, unspecified: Secondary | ICD-10-CM | POA: Diagnosis not present

## 2023-07-03 DIAGNOSIS — Z515 Encounter for palliative care: Secondary | ICD-10-CM | POA: Diagnosis not present

## 2023-07-03 DIAGNOSIS — I63512 Cerebral infarction due to unspecified occlusion or stenosis of left middle cerebral artery: Secondary | ICD-10-CM | POA: Diagnosis not present

## 2023-07-03 DIAGNOSIS — I63522 Cerebral infarction due to unspecified occlusion or stenosis of left anterior cerebral artery: Secondary | ICD-10-CM | POA: Diagnosis not present

## 2023-07-03 DIAGNOSIS — I959 Hypotension, unspecified: Secondary | ICD-10-CM

## 2023-07-03 DIAGNOSIS — Z7189 Other specified counseling: Secondary | ICD-10-CM | POA: Diagnosis not present

## 2023-07-03 DIAGNOSIS — J449 Chronic obstructive pulmonary disease, unspecified: Secondary | ICD-10-CM | POA: Diagnosis not present

## 2023-07-03 DIAGNOSIS — N179 Acute kidney failure, unspecified: Secondary | ICD-10-CM

## 2023-07-03 DIAGNOSIS — I6522 Occlusion and stenosis of left carotid artery: Secondary | ICD-10-CM | POA: Diagnosis not present

## 2023-07-03 DIAGNOSIS — R739 Hyperglycemia, unspecified: Secondary | ICD-10-CM

## 2023-07-03 DIAGNOSIS — R509 Fever, unspecified: Secondary | ICD-10-CM

## 2023-07-03 LAB — GLUCOSE, CAPILLARY
Glucose-Capillary: 124 mg/dL — ABNORMAL HIGH (ref 70–99)
Glucose-Capillary: 135 mg/dL — ABNORMAL HIGH (ref 70–99)
Glucose-Capillary: 176 mg/dL — ABNORMAL HIGH (ref 70–99)
Glucose-Capillary: 255 mg/dL — ABNORMAL HIGH (ref 70–99)
Glucose-Capillary: 199 mg/dL — ABNORMAL HIGH (ref 70–99)
Glucose-Capillary: 194 mg/dL — ABNORMAL HIGH (ref 70–99)

## 2023-07-03 LAB — CBC
HCT: 41.3 % (ref 39.0–52.0)
Hemoglobin: 13.1 g/dL (ref 13.0–17.0)
MCH: 32.2 pg (ref 26.0–34.0)
MCHC: 31.7 g/dL (ref 30.0–36.0)
MCV: 101.5 fL — ABNORMAL HIGH (ref 80.0–100.0)
Platelets: 281 10*3/uL (ref 150–400)
RBC: 4.07 MIL/uL — ABNORMAL LOW (ref 4.22–5.81)
RDW: 13.6 % (ref 11.5–15.5)
WBC: 15.9 10*3/uL — ABNORMAL HIGH (ref 4.0–10.5)
nRBC: 0 % (ref 0.0–0.2)

## 2023-07-03 LAB — BASIC METABOLIC PANEL WITH GFR
Anion gap: 9 (ref 5–15)
BUN: 70 mg/dL — ABNORMAL HIGH (ref 8–23)
CO2: 29 mmol/L (ref 22–32)
Calcium: 8.9 mg/dL (ref 8.9–10.3)
Chloride: 113 mmol/L — ABNORMAL HIGH (ref 98–111)
Creatinine, Ser: 1.33 mg/dL — ABNORMAL HIGH (ref 0.61–1.24)
GFR, Estimated: 55 mL/min — ABNORMAL LOW (ref >60)
Glucose, Bld: 115 mg/dL — ABNORMAL HIGH (ref 70–99)
Potassium: 5 mmol/L (ref 3.5–5.1)
Sodium: 151 mmol/L — ABNORMAL HIGH (ref 135–145)

## 2023-07-03 LAB — RESPIRATORY PANEL BY PCR

## 2023-07-03 LAB — PROCALCITONIN: Procalcitonin: 0.26 ng/mL

## 2023-07-03 MED ORDER — SODIUM CHLORIDE 0.9 % IV SOLN
250.0000 mL | INTRAVENOUS | Status: AC
  Administered 2023-07-03: 250 mL via INTRAVENOUS

## 2023-07-03 MED ORDER — ORAL CARE MOUTH RINSE
15.0000 mL | OROMUCOSAL | Status: DC
  Administered 2023-07-03 – 2023-07-08 (×63): 15 mL via OROMUCOSAL

## 2023-07-03 MED ORDER — ORAL CARE MOUTH RINSE: 15.0000 mL | OROMUCOSAL | Status: DC

## 2023-07-03 MED ORDER — FREE WATER
200.0000 mL | Status: DC
  Administered 2023-07-03 – 2023-07-04 (×6): 200 mL

## 2023-07-03 MED ORDER — ATORVASTATIN CALCIUM 80 MG PO TABS
80.0000 mg | ORAL_TABLET | Freq: Every day | ORAL | Status: DC
  Administered 2023-07-03 – 2023-07-08 (×6): 80 mg
  Filled 2023-07-03 (×6): qty 1

## 2023-07-03 MED ORDER — LACTATED RINGERS IV BOLUS
500.0000 mL | Freq: Once | INTRAVENOUS | Status: AC
  Administered 2023-07-03: 500 mL via INTRAVENOUS

## 2023-07-03 MED ORDER — PHENYLEPHRINE HCL-NACL 20-0.9 MG/250ML-% IV SOLN
25.0000 ug/min | INTRAVENOUS | Status: DC
  Administered 2023-07-03: 35 ug/min via INTRAVENOUS
  Administered 2023-07-03: 25 ug/min via INTRAVENOUS
  Administered 2023-07-03: 20 ug/min via INTRAVENOUS
  Administered 2023-07-03: 85 ug/min via INTRAVENOUS
  Administered 2023-07-06: 20 ug/min via INTRAVENOUS
  Filled 2023-07-03 (×4): qty 250

## 2023-07-03 MED ORDER — METHYLPREDNISOLONE SODIUM SUCC 40 MG IJ SOLR
40.0000 mg | Freq: Two times a day (BID) | INTRAMUSCULAR | Status: DC
  Administered 2023-07-03 (×2): 40 mg via INTRAVENOUS
  Filled 2023-07-03 (×2): qty 1

## 2023-07-03 MED ORDER — BISACODYL 10 MG RE SUPP
10.0000 mg | Freq: Once | RECTAL | Status: AC
  Administered 2023-07-03: 10 mg via RECTAL
  Filled 2023-07-03: qty 1

## 2023-07-03 MED ORDER — LACTULOSE 10 GM/15ML PO SOLN
20.0000 g | ORAL | Status: AC
  Administered 2023-07-03 – 2023-07-04 (×4): 20 g
  Filled 2023-07-03 (×4): qty 30

## 2023-07-03 MED ORDER — PHENYLEPHRINE HCL-NACL 20-0.9 MG/250ML-% IV SOLN: 0.0000 ug/min | INTRAVENOUS | Status: DC

## 2023-07-03 NOTE — Progress Notes (Signed)
 SLP Cancellation Note  Patient Details Name: Rickey Baker MRN: [id number] DOB: 06-17-1945   Cancelled treatment:       Reason Eval/Treat Not Completed: Patient not medically ready. Pt intubated. Will f/u for plan    Valeri Gate 07/03/2023, 8:54 AM

## 2023-07-03 NOTE — Progress Notes (Signed)
 Nutrition Follow-up  DOCUMENTATION CODES:   Non-severe (moderate) malnutrition in context of chronic illness  INTERVENTION:   Tube feeding via Cortrak tube: Osmolite 1.5 @ 50 ml/hr (1200 ml per day)  Prosource TF20 60 ml TID  Provides 2040 kcal, 135 gm protein, 912 ml free water daily  200 ml free water every 4 hours Total free water: 2112 ml   100 mg thiamine  daily via tube   NUTRITION DIAGNOSIS:   Moderate Malnutrition related to chronic illness as evidenced by moderate fat depletion, moderate muscle depletion. Ongoing.   GOAL:   Patient will meet greater than or equal to 90% of their needs Met with TF at goal.   MONITOR:   TF tolerance  REASON FOR ASSESSMENT:    (Cortrak tube)    ASSESSMENT:   Pt with PMH of COPD, HTN, Parkinson's, PTSD, and DM admitted with right-sided weakness and aphasia dx with L IC and MC occlusion s/p TNK and IR for stent.   Pt discussed during ICU rounds and with RN and MD.  Pt required re-intubation. Per CCM plans for GOC discussion with family today. Palliative care consulted.   5/6 - admit; intubated; s/p TNA and IR for L M2 thrombectomy, L ICA stent with angioplasty 5/7 - s/p cortrak placement-gastric; extubated 5/8 - start TF 5/11 - re-intubated    Medications reviewed and include: sinemet  TID, SSI every 4 hours, 7 units semglee BID, lactulose, solumedrol, protonix , miralax , senokot-s, thiamine   Precedex Fentanyl   Phenylephrine  @ 35 mcg  Labs reviewed:  Na 151 A1C 6.2 CBG's: 124-255   Diet Order:   Diet Order             Diet NPO time specified  Diet effective now                   EDUCATION NEEDS:   Not appropriate for education at this time  Skin:  Skin Assessment: Reviewed RN Assessment (skin tear R elbow)  Last BM:  unknown; abd soft  Height:   Ht Readings from Last 1 Encounters:  06/27/23 5' 9.5" (1.765 m)    Weight:   Wt Readings from Last 1 Encounters:  07/02/23 86.4 kg    BMI:  Body  mass index is 27.73 kg/m.  Estimated Nutritional Needs:   Kcal:  2000-2200  Protein:  125-140 grams  Fluid:  >2 L/day  Randine Butcher., RD, LDN, CNSC See AMiON for contact information

## 2023-07-03 NOTE — Progress Notes (Addendum)
 PT Cancellation Note  Patient Details Name: Rickey Baker MRN: [id number] DOB: [birth year]   Cancelled Treatment:    Reason Eval/Treat Not Completed: Medical issues which prohibited therapy (pt now intubated and requiring pressors)  Verdia Glad, PT, DPT Acute Rehabilitation Services Office [phone]    Claria Crofts 07/03/2023, 7:52 AM

## 2023-07-03 NOTE — Progress Notes (Signed)
 Pt not moving to pain in any extremity.  This assessment contradicts previous charting and the report that I have received.  Paged Dr. Alecia Ames.  STAT CT ordered.

## 2023-07-03 NOTE — Progress Notes (Signed)
 OT Cancellation Note  Patient Details Name: Rickey Baker MRN: [id number] DOB: 04/05/1945   Cancelled Treatment:    Reason Eval/Treat Not Completed: Patient not medically ready (pt now intubated).   Bary Boss, OT Acute Rehabilitation Services Office [phone] Secure Chat Preferred    Fredrich Jefferson 07/03/2023, 7:55 AM

## 2023-07-03 NOTE — Consult Note (Signed)
 Consultation Note Date: 07/03/2023   Patient Name: Rickey Baker  DOB: May 28, 1945  MRN: [id number]  Age / Sex: 78 y.o., male  [facility] Referring Physician: Stroke, Md, MD  Reason for Consultation: Establishing goals of care  HPI/Patient Profile: 78 y.o. 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 S/P L M2 thrombectomy and L ICA stent placement. 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 Intubated emergently 5/11.  Clinical Assessment and Goals of Care: Consult received and chart review complete. I met today at Rickey Baker's bedside along with RN. RN updated me on status and family involved in care. Plans for meeting today ~5pm with PCCM to discuss care and expectations.   I attempted to call but was unable to connect with wife, Rickey Baker.   I called and was able to speak with daughter, Rickey Baker. 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 She has overall good understanding of her father's condition. She reports significant lung disease as well as Parkinson's disease prior to admission. We discussed the unfortunate circumstances that we know the trajectory of those disease processes but now with stroke and aspiration risk this changes things very drastically. Rickey Baker shares that they knew they would need to connect with palliative care at some point but this has come much sooner. Rickey Baker is taking all this information in stride but worries about her father's wife and her brother. She knows that they are struggling with the severity of Rickey Baker's illness and condition. Rickey Baker, wife Rickey Baker, and another son are most involved in discussions. He has 2 other sons - 1 will be flying into town Friday and the other son is estranged and not really  involved.   Rickey Baker shares the struggles of knowing her father's wishes and acceptable quality of life. She shares that he once told them not to worry and place him in a facility when needed and they can keep collecting his pension (unsure how much he was joking or serious). Then at another point of time they were looking into ALF placement and he was very opposed. This leaves family not having a good idea of what he would want for himself. Rickey Baker will speak with her family and let me know a time we can meet together with me tomorrow. They do plan to meet with PCCM this evening but I will not be able to be present this evening.   I was able to connect with wife Rickey Baker. I introduced myself to Rickey Baker and explained palliative care as a support to her and her family and to help ensure they are receiving all the information they need to understand what is going on with her husband. I also explained that I can help them discuss and think through any decisions they may be faced with. Rickey Baker is open to meeting and will coordinate with Rickey Baker.   Discussed with RN Rickey Baker who will  plan to ask this evening when family comes to bedside about any documentation for HCPOA/Living Will and to bring a copy for us  to have on file.   All questions/concerns addressed. Both Rickey Baker and Rickey Baker provided with my contact information. Emotional support provided. Discussed with RN and Dr. Felipe Baker.   Primary Decision Maker NEXT OF KIN wife Rickey Baker (unless HCPOA documented otherwise)    SUMMARY OF RECOMMENDATIONS   - Awaiting time for family meeting tomorrow  Code Status/Advance Care Planning: Full code - not discussed today   Symptom Management:  Per stroke team and PCCM.   Prognosis:  Overall prognosis poor.   Discharge Planning: To Be Determined      Primary Diagnoses: Present on Admission:  Stroke (cerebrum) (HCC)  Acute hypoxic respiratory failure (HCC)   I have reviewed the medical record, interviewed the patient and  family, and examined the patient. The following aspects are pertinent.  Past Medical History:  Diagnosis Date   Arthritis    COPD (chronic obstructive pulmonary disease) (HCC)    Hyperlipidemia    Hypertension    Parkinson's disease (HCC)    PTSD (post-traumatic stress disorder)    Social History   Socioeconomic History   Marital status: Married    Spouse name: Not on file   Number of children: Not on file   Years of education: Not on file   Highest education level: Not on file  Occupational History   Not on file  Tobacco Use   Smoking status: Every Day    Current packs/day: 1.00    Average packs/day: 1 pack/day for 50.0 years (50.0 ttl pk-yrs)    Types: Cigarettes   Smokeless tobacco: Never   Tobacco comments:    0.5ppd 10/07/2022  Vaping Use   Vaping status: Never Used  Substance and Sexual Activity   Alcohol use: Not Currently   Drug use: Not Currently   Sexual activity: Not on file  Other Topics Concern   Not on file  Social History Narrative   Not on file   Social Drivers of Health   Financial Resource Strain: Not on file  Food Insecurity: Patient Unable To Answer (06/29/2023)   Hunger Vital Sign    Worried About Running Out of Food in the Last Year: Patient unable to answer    Ran Out of Food in the Last Year: Patient unable to answer  Transportation Needs: Patient Unable To Answer (06/29/2023)   PRAPARE - Transportation    Lack of Transportation (Medical): Patient unable to answer    Lack of Transportation (Non-Medical): Patient unable to answer  Physical Activity: Not on file  Stress: Not on file  Social Connections: Unknown (06/29/2023)   Social Connection and Isolation Panel [NHANES]    Frequency of Communication with Friends and Family: Patient unable to answer    Frequency of Social Gatherings with Friends and Family: Patient unable to answer    Attends Religious Services: Patient unable to answer    Active Member of Clubs or Organizations: Patient  unable to answer    Attends Banker Meetings: Not on file    Marital Status: Patient unable to answer   Family History  Problem Relation Age of Onset   Asthma Son    Scheduled Meds:   stroke: early stages of recovery book   Does not apply Once   arformoterol   15 mcg Nebulization BID   aspirin   81 mg Per Tube Daily   atorvastatin   80 mg Per Tube Daily  budesonide  (PULMICORT ) nebulizer solution  0.5 mg Nebulization BID   carbidopa -levodopa   1.5 tablet Per Tube TID   Chlorhexidine  Gluconate Cloth  6 each Topical Daily   enoxaparin  (LOVENOX ) injection  40 mg Subcutaneous Daily   feeding supplement (PROSource TF20)  60 mL Per Tube TID   free water  200 mL Per Tube Q4H   insulin  aspart  0-15 Units Subcutaneous Q4H   insulin  glargine-yfgn  7 Units Subcutaneous BID   methylPREDNISolone  (SOLU-MEDROL ) injection  40 mg Intravenous Q12H   multivitamin with minerals  1 tablet Per Tube Daily   mouth rinse  15 mL Mouth Rinse Q2H   pantoprazole  (PROTONIX ) IV  40 mg Intravenous QHS   polyethylene glycol  17 g Per Tube Daily   revefenacin   175 mcg Nebulization Daily   senna-docusate  1 tablet Per Tube BID   thiamine   100 mg Per Tube Daily   ticagrelor   90 mg Per Tube BID   Continuous Infusions:  sodium chloride  Stopped (07/03/23 0231)   cefTRIAXone  (ROCEPHIN )  IV Stopped (07/02/23 0853)   dexmedetomidine (PRECEDEX) IV infusion 0.5 mcg/kg/hr (07/03/23 0700)   feeding supplement (OSMOLITE 1.5 CAL) 1,000 mL (07/03/23 0952)   fentaNYL  infusion INTRAVENOUS 25 mcg/hr (07/03/23 0700)   phenylephrine  (NEO-SYNEPHRINE) Adult infusion 85 mcg/min (07/03/23 0859)   PRN Meds:.acetaminophen  **OR** acetaminophen  (TYLENOL ) oral liquid 160 mg/5 mL **OR** acetaminophen , albuterol , fentaNYL  (SUBLIMAZE ) injection, hydrALAZINE , mouth rinse Allergies  Allergen Reactions   Indomethacin Nausea And Vomiting and Other (See Comments)    Other reaction(s): Blurred vision Ear ringing    Review of  Systems  Unable to perform ROS: Intubated    Physical Exam Vitals and nursing note reviewed.  Constitutional:      General: He is not in acute distress.    Interventions: He is intubated.  Cardiovascular:     Rate and Rhythm: Bradycardia present.  Pulmonary:     Effort: No tachypnea, accessory muscle usage or respiratory distress. He is intubated.  Abdominal:     Palpations: Abdomen is soft.     Comments: Bloated  Neurological:     Comments: Sedated on vent. Eyes will open and brief tracking. Not following commands.      Vital Signs: BP 104/72   Pulse (!) 58   Temp 100.2 F (37.9 C) (Axillary)   Resp (!) 26   Ht 5' 9.5" (1.765 m)   Wt 86.4 kg   SpO2 97%   BMI 27.73 kg/m  Pain Scale: CPOT   Pain Score: Asleep   SpO2: SpO2: 97 % O2 Device:SpO2: 97 % O2 Flow Rate: .O2 Flow Rate (L/min): 15 L/min  IO: Intake/output summary:  Intake/Output Summary (Last 24 hours) at 07/03/2023 1002 Last data filed at 07/03/2023 0951 Gross per 24 hour  Intake 1685.02 ml  Output 1300 ml  Net 385.02 ml    LBM: Last BM Date :  (PTA) Baseline Weight: Weight: 82.8 kg Most recent weight: Weight: 86.4 kg     Palliative Assessment/Data:    Time Total: 80 min  Greater than 50%  of this time was spent counseling and coordinating care related to the above assessment and plan.  Signed by: [name], NP Palliative Medicine Team Pager # [phone] (M-F 8a-5p) Team Phone # [phone] (Nights/Weekends)

## 2023-07-03 NOTE — Progress Notes (Signed)
 Upon arrival to 4NICU at 2313, pt unresponsive, not f/c, not moving extremities.  Dr. Charon Copper with CCM at bedside.  Decision to intubate was made.  See charting in [facility] for RSI medication administration.    Post intubation, pt's BP dropped into 70's systolic.  Dr. Charon Copper notified.  VO to give 5mL of phenylephrine  stick.  Dr. Charon Copper at bedside during administration.  5mL of neo stick given at 2329.  VO for 500cc bolus of LR given by Dr. Charon Copper.  Bolus started.  BP improved temporarily and then dropped again.  Dr. Charon Copper still on the floor, came to bedside.  VO to give another 2mL of neo stick.  Administered at 2351 with MD at the bedside.  VO to give another 500cc bolus of LR.  BP still labile and remained in the 80's systolic.  VO from Dr. Charon Copper to start phenylephrine  gtt.  Gtt started and titrated to bring pt's BP to goal.

## 2023-07-03 NOTE — Progress Notes (Signed)
 Pt transported on ventilator to CT and back to 4N17 with no complications

## 2023-07-03 NOTE — Progress Notes (Signed)
 Inpatient Rehab Admissions Coordinator:    CIR following at a distance. Pt. Currently on vent, not appropriate.   Wandalee Gust, MS, CCC-SLP Rehab Admissions Coordinator  [phone] (celll) [phone] (office)

## 2023-07-03 NOTE — Progress Notes (Signed)
 STROKE TEAM PROGRESS NOTE   INTERIM HISTORY/SUBJECTIVE Wife and daughter and son-in-law are at bedside.  Patient was intubated overnight for respiratory distress, presumably due to aspiration.   OBJECTIVE  CBC    Component Value Date/Time   WBC 15.9 (H) 07/03/2023 0606   RBC 4.07 (L) 07/03/2023 0606   HGB 13.1 07/03/2023 0606   HCT 41.3 07/03/2023 0606   PLT 281 07/03/2023 0606   MCV 101.5 (H) 07/03/2023 0606   MCH 32.2 07/03/2023 0606   MCHC 31.7 07/03/2023 0606   RDW 13.6 07/03/2023 0606   LYMPHSABS 0.7 06/27/2023 1954   MONOABS 0.8 06/27/2023 1954   EOSABS 0.2 06/27/2023 1954   BASOSABS 0.1 06/27/2023 1954    BMET    Component Value Date/Time   NA 151 (H) 07/03/2023 0606   K 5.0 07/03/2023 0606   CL 113 (H) 07/03/2023 0606   CO2 29 07/03/2023 0606   GLUCOSE 115 (H) 07/03/2023 0606   BUN 70 (H) 07/03/2023 0606   CREATININE 1.33 (H) 07/03/2023 0606   CALCIUM  8.9 07/03/2023 0606   GFRNONAA 55 (L) 07/03/2023 0606    IMAGING past 24 hours CT HEAD WO CONTRAST ( ) Result Date: 07/03/2023 CLINICAL DATA:  Follow-up examination for stroke. EXAM: CT HEAD WITHOUT CONTRAST TECHNIQUE: Contiguous axial images were obtained from the base of the skull through the vertex without intravenous contrast. RADIATION DOSE REDUCTION: This exam was performed according to the departmental dose-optimization program which includes automated exposure control, adjustment of the mA and/or kV according to patient size and/or use of iterative reconstruction technique. COMPARISON:  Prior CT and MRI from 06/28/2023 FINDINGS: Brain: Examination degraded by motion. Age-related cerebral atrophy with chronic microvascular ischemic disease. Remote right basal ganglia lacunar infarct noted. Continued normal interval evolution of previously identified left MCA distribution infarcts, most pronounced at the left basal ganglia. Overall size and distribution is relatively similar to prior MRI. No hemorrhagic  transformation or significant regional mass effect. No other acute intracranial hemorrhage. No other acute large vessel territory infarct. No mass lesion or midline shift. No hydrocephalus or extra-axial fluid collection. Vascular: No abnormal hyperdense vessel. Scattered calcified atherosclerosis present about the skull base. Skull: Scalp soft tissues and calvarium demonstrate no new finding. Sinuses/Orbits: Globes and orbital soft tissues within normal limits. Scattered mucosal thickening with a few air-fluid levels noted about the paranasal sinuses. Mastoid air cells remain clear. Patient is intubated. Other: None. IMPRESSION: 1. Normal expected interval evolution of previously identified left MCA distribution infarcts, most pronounced at the left basal ganglia. No hemorrhagic transformation or significant regional mass effect. 2. No other new acute intracranial abnormality. 3. Underlying atrophy with chronic small vessel ischemic disease, with additional remote right basal ganglia lacunar infarct. Electronically Signed   By: Virgia Griffins M.D.   On: 07/03/2023 02:06   DG CHEST PORT 1 VIEW Result Date: 07/03/2023 CLINICAL DATA:  [id number] Endotracheal tube present [id number] EXAM: PORTABLE CHEST 1 VIEW COMPARISON:  CT chest 10/07/2022, chest x-ray 07/02/2023 10:13 p.m., Chest x-ray 06/29/2023 FINDINGS: Endotracheal tube terminating 6 cm above the carina. Enteric tube coursing below the diaphragm with tip collimated off view. The heart and mediastinal contours are unchanged. Atherosclerotic plaque No focal consolidation. Chronic coarsened interstitial markings with no overt pulmonary edema. No pleural effusion. No pneumothorax. Bilateral costophrenic angle collimated off view. No acute osseous abnormality. IMPRESSION: 1. Lines and tubes as above. 2. No active disease. 3. Aortic Atherosclerosis (ICD10-I70.0) and Emphysema (ICD10-J43.9). Electronically Signed   By: Morgane  Naveau M.D.  On: 07/03/2023 01:11    DG CHEST PORT 1 VIEW Result Date: 07/02/2023 CLINICAL DATA:  Shortness of breath. EXAM: PORTABLE CHEST 1 VIEW COMPARISON:  06/29/2023 FINDINGS: Weighted enteric tube tip below the diaphragm not included in the field of view. Cardiomegaly stable. Mediastinal contours are unchanged. Emphysema. Ill-defined opacity at the left lung base, with improvement from recent prior. No pneumothorax or pleural effusion. IMPRESSION: 1. Ill-defined opacity at the left lung base, with improvement from recent prior. This may represent atelectasis or pneumonia. 2. Emphysema. 3. Stable cardiomegaly. Electronically Signed   By: Chadwick Colonel M.D.   On: 07/02/2023 22:51     Vitals:   07/03/23 0915 07/03/23 0930 07/03/23 0945 07/03/23 1000  BP: 113/74 116/73 118/75 122/77  Pulse: (!) 55 (!) 55 (!) 55 (!) 57  Resp: (!) 26 (!) 26 (!) 26 (!) 26  Temp:      TempSrc:      SpO2: 97% 98% 98% 98%  Weight:      Height:         PHYSICAL EXAM  Temp:  [98.6 F (37 C)-102.3 F (39.1 C)] 100.2 F (37.9 C) (05/12 0800) Pulse Rate:  [55-111] 57 (05/12 1000) Resp:  [16-35] 26 (05/12 1000) BP: (74-180)/(52-113) 122/77 (05/12 1000) SpO2:  [93 %-98 %] 98 % (05/12 1000) FiO2 (%):  [40 %-60 %] 40 % (05/12 0805)  General - Well nourished, well developed, intubated on low dose sedation.  Ophthalmologic - fundi not visualized due to noncooperation.  Cardiovascular - Regular rate and rhythm.  Neuro - intubated on low dose fentanyl  and precedex, eyes open on voice, seemed closed eyes on request x2, but not following other commands. With eye opening, eyes in mid position, blinking to visual threat on the left but not on the right, able to track on the left but not on the right visual field. Corneal reflex present, gag and cough present. Breathing over the vent.  Facial symmetry not able to test due to ET tube.  Tongue protrusion not cooperative. Spontaneously moving LUE against gravity but not holding on commands, slowly  drift down to bed within 10 sec. LLE withdraw to pain. RUE and RLE hemiplegia. Sensation, coordination not cooperative and gait not tested.   ASSESSMENT/PLAN  Rickey Baker is a 78 y.o. male with history of hypertension, hyperlipidemia, Parkinson's disease who presents with right-sided weakness and aphasia .  He was found to have left carotid and left MCA occlusion and was given TNK.  He was taken for mechanical thrombectomy and underwent rescue left ICA stenting.  He is on Rocephin  for aspiration pneumonia.  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 ASPECTS 10.   CTA head & neck  Occluded left ICA and left MCA. Approximately 40% stenosis of the proximal right ICA.  Moderate stenosis of right VA.  Moderate right brachiocephalic and left subclavian artery S/p IR with TICI3 and left ICA stenting MRI Acute infarct affecting much of the left caudate and lentiform nuclei. Patchy acute cortically-based infarcts scattered within the frontal, parietal and temporal lobes on the left (MCA and ACA vascular territories). Additional small acute infarct along the atrium of the left lateral ventricle. Punctate acute cortical infarct within the perirolandic medial right frontoparietal lobes.  2D Echo EF 35-40%  LDL 130 HgbA1c 6.2 VTE prophylaxis - Lovenox  No antithrombotic prior to admission, now on aspirin  81 mg daily and Brilinta  (ticagrelor ) 90 mg bid for ICA stent Therapy recommendations:  CIR Disposition: Pending  Respiratory insufficiency Aspiration Fever PCCM consulted for airway management Extubated 5/7  Reintubated 5/11 Vent management per CCM May need trach  Tmax 102.3-> 100.7 On Rocephin   Hx of hypertension hypotension BP low 5/11-5/12 Concerning for sepsis CCM on  board Of neo Long-term BP goal normotensive  Hyperlipidemia Home meds:  Zetia 5 and Lipitor 40  LDL 130, goal < 70 Now on atorvastatin  80mg  Continue statin at discharge  Hyperglycemia A1c 6.2, goal less than 7.0 Elevated glucose Now on insulin  7U bid  Dysphagia Patient has post-stroke dysphagia SLP on board NPO Now on tube feeding May need a PEG tube  Hypernatremia AKI Na 146--149--151 Creatinine 1.07--1.33 On tube feeding BMP monitoring  Other Stroke Risk Factors Obesity, Body mass index is 27.73 kg/m., BMI >/= 30 associated with increased stroke risk, recommend weight loss, diet and exercise as appropriate  Advanced age  Other Active Problems Parkinson's disease Sinemet  resumed Gabapentin, Hydroxyzine, Ropinirole , Topiramate - on hold until med rec is done  Hospital day # 6  This patient is critically ill due to respiratory failure, sepsis, aspiration, stroke status post IR and TNK and stenting and at significant risk of neurological worsening, death form recurrent stroke, hemorrhagic transformation, septic shock, renal failure. This patient's care requires constant monitoring of vital signs, hemodynamics, respiratory and cardiac monitoring, review of multiple databases, neurological assessment, discussion with family, other specialists and medical decision making of high complexity. I spent 50 minutes of neurocritical care time in the care of this patient. I had long discussion with wife and daughter at bedside, updated pt current condition, treatment plan and potential prognosis, and answered all the questions.  They expressed understanding and appreciation.   Consuelo Denmark, MD PhD Stroke Neurology 07/03/2023 8:55 PM    To contact Stroke Continuity provider, please refer to [email or web address]. After hours, contact General Neurology

## 2023-07-03 NOTE — Progress Notes (Addendum)
 NAME:  Rickey Baker, MRN:  [id number], DOB:  [birth year], LOS: 6 ADMISSION DATE:  06/27/2023, CONSULTATION DATE:  06/27/23 REFERRING MD:  Alecia Ames CHIEF COMPLAINT:  Vent Management   History of Present Illness:  Pt is encephelopathic; therefore, this HPI is obtained from chart review. Rickey Baker is a 78 y.o. male who has a PMH as below clued but not limited to hypertension, hyperlipidemia, Parkinson disease.  He presented to [facility] ED on 06/27/2023 with right-sided weakness and aphasia.  He was in his normal state of health earlier in the evening when his wife helped him lie down for a nap around 5 PM.  When she woke up later, she found that he was slumped to the side and unable to talk.  She called 911 and he was brought to the ED as a code stroke.  CT revealed no hemorrhage, CTA confirmed a left ICA/MCA occlusion.  He did receive TNKase  at 2004.  He was then taken to neuro IR where he had a left M2 thrombectomy, L ICA stent placement with angioplasty performed.  Just prior to the procedure he apparently had a vomiting episode requiring suctioning therefore, following the procedure, he remained intubated and was transferred to the ICU PCCM was subsequently called in consultation for vent management. Per CRNA, he was awake during vomiting episode and had a strong cough during suctioning and there was NOT any concern for aspiration event.  Pertinent  Medical History:  has Acute hypoxic respiratory failure (HCC); COPD with acute exacerbation (HCC); Stroke (cerebrum) (HCC); Hypertension associated with diabetes (HCC); and Malnutrition of moderate degree on their problem list.  Significant Hospital Events: Including procedures, antibiotic start and stop dates in addition to other pertinent events   5/6 admit, to [facility] for left M2 thrombectomy, L ICA stent placement with angioplasty.  MRI s 5/7 CTH stable, increasing congestion, aspiration coverage started, diuresis 5/11 obtunded and reintubated, pressors  after intubation  Interim History / Subjective:  Overnight events noted.  Objective:  Blood pressure 114/75, pulse 79, temperature 100.3 F (37.9 C), temperature source Axillary, resp. rate (!) 24, height 5' 9.5" (1.765 m), weight 86.4 kg, SpO2 95%.    Vent Mode: PRVC FiO2 (%):  [40 %-60 %] 40 % Set Rate:  [18 bmp-22 bmp] 22 bmp Vt Set:  [580 mL] 580 mL PEEP:  [5 cmH20] 5 cmH20 Plateau Pressure:  [13 cmH20] 13 cmH20   Intake/Output Summary (Last 24 hours) at 07/03/2023 0720 Last data filed at 07/03/2023 0636 Gross per 24 hour  Intake 1479.7 ml  Output 2050 ml  Net -570.3 ml   Filed Weights   06/30/23 0500 07/01/23 0350 07/02/23 0438  Weight: 83.3 kg 83.7 kg 86.4 kg    Examination: No distress Dense R hemiplegia Weak but moves left Tracking, won't follow commands for me Overbreathing vent, tachypneic; very diminished, bronchospasm on vent Ext warm Old R neck scar noted  Labs pending CXR, CT reviewed  Assessment & Plan:   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  Residual R hemiplegia but exam was improving per notes. COPD hx, GOLD D, frequent exacerbations Recurrent resp failure- sounds like aspiration + baseline  Hx HTN, CHF, Parkinson's, DM  - Vent support, PAD, increase mandatory rate a bit - Check RVP, Pct - Triple nep therapy, steroids - Continue sinemet  - GOC discussion, baseline poor respiratory health, may be headed down trach/vent/snf route and daughter not sure that's what  he would want.  Will ask PMT to help. - GDMT, antiplatelets per neurology  Best practice (evaluated daily):  Diet/type: NPO; EN per RD DVT prophylaxis:  start VTE  Pressure ulcer(s): per nursing documentation GI prophylaxis: PPI Lines: N/A Foley:  N/A Code Status:  full code Last date of multidisciplinary goals of care discussion: updated daughter 5/12, wife to come in later today; all would like to be present for  GOC  33 min cc time Ardelle Kos MD PCCM

## 2023-07-03 NOTE — Progress Notes (Signed)
 Pt R=32, HR= 133, O2=84 on 2LNC, and BP=180/113. O2 was increased to 5L with no improvement in O2 saturation. MD, RT and Rapid response notified.  Pt was transferred to [id number].

## 2023-07-04 ENCOUNTER — Inpatient Hospital Stay (HOSPITAL_COMMUNITY)

## 2023-07-04 DIAGNOSIS — Z515 Encounter for palliative care: Secondary | ICD-10-CM | POA: Diagnosis not present

## 2023-07-04 DIAGNOSIS — I779 Disorder of arteries and arterioles, unspecified: Secondary | ICD-10-CM | POA: Diagnosis not present

## 2023-07-04 DIAGNOSIS — Z7189 Other specified counseling: Secondary | ICD-10-CM | POA: Diagnosis not present

## 2023-07-04 DIAGNOSIS — I63522 Cerebral infarction due to unspecified occlusion or stenosis of left anterior cerebral artery: Secondary | ICD-10-CM | POA: Diagnosis not present

## 2023-07-04 DIAGNOSIS — I63512 Cerebral infarction due to unspecified occlusion or stenosis of left middle cerebral artery: Secondary | ICD-10-CM | POA: Diagnosis not present

## 2023-07-04 DIAGNOSIS — R609 Edema, unspecified: Secondary | ICD-10-CM

## 2023-07-04 DIAGNOSIS — I6522 Occlusion and stenosis of left carotid artery: Secondary | ICD-10-CM | POA: Diagnosis not present

## 2023-07-04 DIAGNOSIS — K5900 Constipation, unspecified: Secondary | ICD-10-CM | POA: Diagnosis not present

## 2023-07-04 DIAGNOSIS — I63232 Cerebral infarction due to unspecified occlusion or stenosis of left carotid arteries: Secondary | ICD-10-CM | POA: Diagnosis not present

## 2023-07-04 DIAGNOSIS — J150 Pneumonia due to Klebsiella pneumoniae: Secondary | ICD-10-CM | POA: Diagnosis not present

## 2023-07-04 DIAGNOSIS — J449 Chronic obstructive pulmonary disease, unspecified: Secondary | ICD-10-CM | POA: Diagnosis not present

## 2023-07-04 LAB — CBC
HCT: 40 % (ref 39.0–52.0)
Hemoglobin: 12.5 g/dL — ABNORMAL LOW (ref 13.0–17.0)
MCH: 32.1 pg (ref 26.0–34.0)
MCHC: 31.3 g/dL (ref 30.0–36.0)
MCV: 102.6 fL — ABNORMAL HIGH (ref 80.0–100.0)
Platelets: 236 10*3/uL (ref 150–400)
RBC: 3.9 MIL/uL — ABNORMAL LOW (ref 4.22–5.81)
RDW: 13.3 % (ref 11.5–15.5)
WBC: 18 10*3/uL — ABNORMAL HIGH (ref 4.0–10.5)
nRBC: 0 % (ref 0.0–0.2)

## 2023-07-04 LAB — GLUCOSE, CAPILLARY
Glucose-Capillary: 152 mg/dL — ABNORMAL HIGH (ref 70–99)
Glucose-Capillary: 161 mg/dL — ABNORMAL HIGH (ref 70–99)
Glucose-Capillary: 178 mg/dL — ABNORMAL HIGH (ref 70–99)
Glucose-Capillary: 181 mg/dL — ABNORMAL HIGH (ref 70–99)
Glucose-Capillary: 239 mg/dL — ABNORMAL HIGH (ref 70–99)
Glucose-Capillary: 270 mg/dL — ABNORMAL HIGH (ref 70–99)

## 2023-07-04 LAB — BASIC METABOLIC PANEL WITH GFR
Anion gap: 11 (ref 5–15)
BUN: 90 mg/dL — ABNORMAL HIGH (ref 8–23)
CO2: 24 mmol/L (ref 22–32)
Calcium: 8.4 mg/dL — ABNORMAL LOW (ref 8.9–10.3)
Chloride: 115 mmol/L — ABNORMAL HIGH (ref 98–111)
Creatinine, Ser: 1.5 mg/dL — ABNORMAL HIGH (ref 0.61–1.24)
GFR, Estimated: 48 mL/min — ABNORMAL LOW (ref >60)
Glucose, Bld: 259 mg/dL — ABNORMAL HIGH (ref 70–99)
Potassium: 5 mmol/L (ref 3.5–5.1)
Sodium: 150 mmol/L — ABNORMAL HIGH (ref 135–145)

## 2023-07-04 MED ORDER — METHYLNALTREXONE BROMIDE 12 MG/0.6ML ~~LOC~~ SOLN: 12.0000 mg | Freq: Once | SUBCUTANEOUS | Status: DC

## 2023-07-04 MED ORDER — FREE WATER
300.0000 mL | Status: DC
  Administered 2023-07-04 – 2023-07-08 (×25): 300 mL

## 2023-07-04 MED ORDER — INSULIN ASPART 100 UNIT/ML IJ SOLN
0.0000 [IU] | INTRAMUSCULAR | Status: DC
  Administered 2023-07-04 – 2023-07-05 (×4): 4 [IU] via SUBCUTANEOUS
  Administered 2023-07-05: 7 [IU] via SUBCUTANEOUS
  Administered 2023-07-05 (×2): 4 [IU] via SUBCUTANEOUS
  Administered 2023-07-05: 7 [IU] via SUBCUTANEOUS
  Administered 2023-07-05: 3 [IU] via SUBCUTANEOUS
  Administered 2023-07-06 (×2): 4 [IU] via SUBCUTANEOUS
  Administered 2023-07-06: 3 [IU] via SUBCUTANEOUS
  Administered 2023-07-06: 4 [IU] via SUBCUTANEOUS
  Administered 2023-07-06: 3 [IU] via SUBCUTANEOUS
  Administered 2023-07-06: 4 [IU] via SUBCUTANEOUS
  Administered 2023-07-07: 7 [IU] via SUBCUTANEOUS
  Administered 2023-07-07 – 2023-07-08 (×6): 4 [IU] via SUBCUTANEOUS

## 2023-07-04 MED ORDER — METHYLPREDNISOLONE SODIUM SUCC 40 MG IJ SOLR: 40.0000 mg | Freq: Every day | INTRAMUSCULAR | Status: DC

## 2023-07-04 MED ORDER — SMOG ENEMA
960.0000 mL | Freq: Once | RECTAL | Status: AC
  Administered 2023-07-04: 960 mL via RECTAL
  Filled 2023-07-04: qty 960

## 2023-07-04 NOTE — Progress Notes (Signed)
 STROKE TEAM PROGRESS NOTE   INTERIM HISTORY/SUBJECTIVE Daughter is at bedside.  Patient still intubated, but eyes open, although not following commands. Palliative care on board, family would like full code and OK with potential trach and PEG. Plan for LTACH placement.    OBJECTIVE  CBC    Component Value Date/Time   WBC 18.0 (H) 07/04/2023 0551   RBC 3.90 (L) 07/04/2023 0551   HGB 12.5 (L) 07/04/2023 0551   HCT 40.0 07/04/2023 0551   PLT 236 07/04/2023 0551   MCV 102.6 (H) 07/04/2023 0551   MCH 32.1 07/04/2023 0551   MCHC 31.3 07/04/2023 0551   RDW 13.3 07/04/2023 0551   LYMPHSABS 0.7 06/27/2023 1954   MONOABS 0.8 06/27/2023 1954   EOSABS 0.2 06/27/2023 1954   BASOSABS 0.1 06/27/2023 1954    BMET    Component Value Date/Time   NA 150 (H) 07/04/2023 0551   K 5.0 07/04/2023 0551   CL 115 (H) 07/04/2023 0551   CO2 24 07/04/2023 0551   GLUCOSE 259 (H) 07/04/2023 0551   BUN 90 (H) 07/04/2023 0551   CREATININE 1.50 (H) 07/04/2023 0551   CALCIUM  8.4 (L) 07/04/2023 0551   GFRNONAA 48 (L) 07/04/2023 0551    IMAGING past 24 hours VAS US  LOWER EXTREMITY VENOUS (DVT) Result Date: 07/04/2023  Lower Venous DVT Study Patient Name:  Rickey Baker  Date of Exam:   07/04/2023 Medical Rec #: [id number]       Accession #:    [id number] Date of Birth: 03-Oct-1945       Patient Gender: M Patient Age:   29 years Exam Location:  [facility] Procedure:      VAS US  LOWER EXTREMITY VENOUS (DVT) Referring Phys: Felton Hough --------------------------------------------------------------------------------  Indications: Edema.  Limitations: Patient positioning. Performing Technologist: Aloma Arrant RVS  Examination Guidelines: A complete evaluation includes B-mode imaging, spectral Doppler, color Doppler, and power Doppler as needed of all accessible portions of each vessel. Bilateral testing is considered an integral part of a complete examination. Limited examinations for reoccurring  indications may be performed as noted. The reflux portion of the exam is performed with the patient in reverse Trendelenburg.  +---------+---------------+---------+-----------+----------+--------------+ RIGHT    CompressibilityPhasicitySpontaneityPropertiesThrombus Aging +---------+---------------+---------+-----------+----------+--------------+ CFV      Full           Yes      Yes                                 +---------+---------------+---------+-----------+----------+--------------+ SFJ      Full                                                        +---------+---------------+---------+-----------+----------+--------------+ FV Prox  Full                                                        +---------+---------------+---------+-----------+----------+--------------+ FV Mid   Full                                                        +---------+---------------+---------+-----------+----------+--------------+  FV DistalFull                                                        +---------+---------------+---------+-----------+----------+--------------+ PFV      Full                                                        +---------+---------------+---------+-----------+----------+--------------+ POP      Full           Yes      Yes                                 +---------+---------------+---------+-----------+----------+--------------+ PTV      Full                                                        +---------+---------------+---------+-----------+----------+--------------+ PERO     Full                                                        +---------+---------------+---------+-----------+----------+--------------+   +---------+---------------+---------+-----------+----------+--------------+ LEFT     CompressibilityPhasicitySpontaneityPropertiesThrombus Aging  +---------+---------------+---------+-----------+----------+--------------+ CFV      Full           Yes      Yes                                 +---------+---------------+---------+-----------+----------+--------------+ SFJ      Full                                                        +---------+---------------+---------+-----------+----------+--------------+ FV Prox  Full                                                        +---------+---------------+---------+-----------+----------+--------------+ FV Mid   Full                                                        +---------+---------------+---------+-----------+----------+--------------+ FV DistalFull                                                        +---------+---------------+---------+-----------+----------+--------------+  PFV      Full                                                        +---------+---------------+---------+-----------+----------+--------------+ POP      Full           Yes      Yes                                 +---------+---------------+---------+-----------+----------+--------------+ PTV      Full                                                        +---------+---------------+---------+-----------+----------+--------------+ PERO     Full                                                        +---------+---------------+---------+-----------+----------+--------------+     Summary: BILATERAL: - No evidence of deep vein thrombosis seen in the lower extremities, bilaterally. -No evidence of popliteal cyst, bilaterally.   *See table(s) above for measurements and observations.    Preliminary      Vitals:   07/04/23 0803 07/04/23 0900 07/04/23 1047 07/04/23 1139  BP:  101/66    Pulse:  77 85   Resp:  17 18   Temp:    99.7 F (37.6 C)  TempSrc:    Axillary  SpO2: 95% 95% 93%   Weight:      Height:         PHYSICAL EXAM  Temp:  [99.7 F (37.6 C)-101.2  F (38.4 C)] 99.7 F (37.6 C) (05/13 1139) Pulse Rate:  [67-106] 85 (05/13 1047) Resp:  [16-30] 18 (05/13 1047) BP: (72-154)/(55-107) 101/66 (05/13 0900) SpO2:  [93 %-96 %] 93 % (05/13 1047) FiO2 (%):  [40 %] 40 % (05/13 1047)  General - Well nourished, well developed, intubated on precedex.  Ophthalmologic - fundi not visualized due to noncooperation.  Cardiovascular - Regular rate and rhythm.  Neuro - intubated on precedex, eyes open on voice, seemed closed eyes on request twice, but not following other commands. With eye opening, eyes in mid position, blinking to visual threat on the left but not on the right, able to track on the left but not on the right visual field. Corneal reflex present, gag and cough present. Breathing over the vent.  Facial symmetry not able to test due to ET tube.  Tongue protrusion not cooperative. Spontaneously moving LUE against gravity but not holding on commands, slowly drift down to bed within 10 sec. LLE withdraw to pain. RUE and RLE hemiplegia. Sensation, coordination not cooperative and gait not tested.   ASSESSMENT/PLAN  [name] is a 78 y.o. male with history of hypertension, hyperlipidemia, Parkinson's disease who presents with right-sided weakness and aphasia .  He was found to have left carotid and left MCA occlusion and was given TNK.  He was taken for mechanical thrombectomy and  underwent rescue left ICA stenting.  He is on Rocephin  for aspiration pneumonia.  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 ASPECTS 10.   CTA head & neck  Occluded left ICA and left MCA. Approximately 40% stenosis of the proximal right ICA.  Moderate stenosis of right VA.  Moderate right brachiocephalic and left subclavian artery S/p IR with TICI3  and left ICA stenting MRI Acute infarct affecting much of the left caudate and lentiform nuclei. Patchy acute cortically-based infarcts scattered within the frontal, parietal and temporal lobes on the left (MCA and ACA vascular territories). Additional small acute infarct along the atrium of the left lateral ventricle. Punctate acute cortical infarct within the perirolandic medial right frontoparietal lobes.  2D Echo EF 35-40%  LDL 130 HgbA1c 6.2 VTE prophylaxis - Lovenox  No antithrombotic prior to admission, now on aspirin  81 mg daily and Brilinta  (ticagrelor ) 90 mg bid for ICA stent Therapy recommendations:  LTACH Disposition: Pending  Respiratory insufficiency Aspiration Fever PCCM consulted for airway management Extubated 5/7  Reintubated 5/11 Vent management per CCM May need trach  Tmax 102.3-> 100.7->100.4 On Rocephin   Hx of hypertension hypotension BP low 5/11-5/12 Concerning for sepsis CCM on board Of neo Long-term BP goal normotensive  Hyperlipidemia Home meds:  Zetia 5 and Lipitor 40  LDL 130, goal < 70 Now on atorvastatin  80mg  Continue statin at discharge  Hyperglycemia A1c 6.2, goal less than 7.0 hyperglycemia Now on insulin  7U bid  Dysphagia Patient has post-stroke dysphagia SLP on board NPO Now on tube feeding May need a PEG tube  Hypernatremia AKI Na 146--149--151--150 Creatinine 1.07--1.33--1.50 On tube feeding BMP monitoring  Other Stroke Risk Factors Obesity, Body mass index is 27.73 kg/m., BMI >/= 30 associated with increased stroke risk, recommend weight loss, diet and exercise as appropriate  Advanced age  Other Active Problems Parkinson's disease Sinemet  resumed Gabapentin, Hydroxyzine, Ropinirole , Topiramate - on hold until med rec is done  Hospital day # 7  This patient is critically ill due to respiratory failure, sepsis, aspiration, stroke status post IR and TNK and stenting and at significant risk of neurological worsening,  death form recurrent stroke, hemorrhagic transformation, septic shock, renal failure. This patient's care requires constant monitoring of vital signs, hemodynamics, respiratory and cardiac monitoring, review of multiple databases, neurological assessment, discussion with family, other specialists and medical decision making of high complexity. I spent 50 minutes of neurocritical care time in the care of this patient. I had long discussion with daughter at bedside, updated pt current condition, treatment plan and potential prognosis, and answered all the questions.  She expressed understanding and appreciation. I also discussed with Dr. Felipe Horton CCM  Consuelo Denmark, MD PhD Stroke Neurology 07/04/2023 11:41 AM    To contact Stroke Continuity provider, please refer to [email or web address]. After hours, contact General Neurology

## 2023-07-04 NOTE — TOC Progression Note (Signed)
 Transition of Care [facility]) - Progression Note    Patient Details  Name: Rickey Baker MRN: [id number] Date of Birth: 12-Nov-1945  Transition of Care [facility]) CM/SW Contact  Baker Kogler M, RN Phone Number: 07/04/2023, 3:14pm  Clinical Narrative:    TOC Consult for LTAC:  Spoke with patient's daughter, Rickey Baker to discuss LTAC and LTAC choice; patient's wife is home sick and daughter available. She states family would prefer [facility] development [facility] for LTAC.  Notified Rickey Baker, admissions liaison with [facility]; she will initiate insurance authorization.  Will provide updates as available.    Expected Discharge Plan: Long Term Acute Care (LTAC) Barriers to Discharge: Continued Medical Work up  Expected Discharge Plan and Services In-house Referral: Clinical Social Work   Post Acute Care Choice: Long Term Acute Care (LTAC) Living arrangements for the past 2 months: Apartment                                       Social Determinants of Health (SDOH) Interventions SDOH Screenings   Food Insecurity: Patient Unable To Answer (06/29/2023)  Housing: Patient Unable To Answer (06/29/2023)  Transportation Needs: Patient Unable To Answer (06/29/2023)  Utilities: Patient Unable To Answer (06/29/2023)  Social Connections: Unknown (06/29/2023)  Tobacco Use: High Risk (07/03/2023)    Readmission Risk Interventions     No data to display         Calla Catchings, RN, BSN  Trauma/Neuro ICU Case Manager [phone]

## 2023-07-04 NOTE — Progress Notes (Deleted)
 Patient was transported to MRI & brought back to 4N17 without complications from a respiratory standpoint. MRI was unable to be done due to possible compatibility issues with pacemaker.

## 2023-07-04 NOTE — Progress Notes (Signed)
 NAME:  Rickey Baker, MRN:  [id number], DOB:  08-22-1945, LOS: 7 ADMISSION DATE:  06/27/2023, CONSULTATION DATE:  06/27/23 REFERRING MD:  Alecia Ames CHIEF COMPLAINT:  Vent Management   History of Present Illness:  Pt is encephelopathic; therefore, this HPI is obtained from chart review. Rickey Baker is a 78 y.o. male who has a PMH as below clued but not limited to hypertension, hyperlipidemia, Parkinson disease.  He presented to [facility] ED on 06/27/2023 with right-sided weakness and aphasia.  He was in his normal state of health earlier in the evening when his wife helped him lie down for a nap around 5 PM.  When she woke up later, she found that he was slumped to the side and unable to talk.  She called 911 and he was brought to the ED as a code stroke.  CT revealed no hemorrhage, CTA confirmed a left ICA/MCA occlusion.  He did receive TNKase  at 2004.  He was then taken to neuro IR where he had a left M2 thrombectomy, L ICA stent placement with angioplasty performed.  Just prior to the procedure he apparently had a vomiting episode requiring suctioning therefore, following the procedure, he remained intubated and was transferred to the ICU PCCM was subsequently called in consultation for vent management. Per CRNA, he was awake during vomiting episode and had a strong cough during suctioning and there was NOT any concern for aspiration event.  Pertinent  Medical History:  has Acute hypoxic respiratory failure (HCC); COPD with acute exacerbation (HCC); Stroke (cerebrum) (HCC); Hypertension associated with diabetes (HCC); and Malnutrition of moderate degree on their problem list.  Significant Hospital Events: Including procedures, antibiotic start and stop dates in addition to other pertinent events   5/6 admit, to [facility] for left M2 thrombectomy, L ICA stent placement with angioplasty.  MRI s 5/7 CTH stable, increasing congestion, aspiration coverage started, diuresis 5/11 obtunded and reintubated, pressors  after intubation 5/12 family meeting #1  Interim History / Subjective:  No events. Sedated on fent, precedex but will track  Objective:  Blood pressure 100/63, pulse 72, temperature (!) 100.4 F (38 C), temperature source Axillary, resp. rate 17, height 5' 9.5" (1.765 m), weight 86.4 kg, SpO2 94%.    Vent Mode: PRVC FiO2 (%):  [40 %] 40 % Set Rate:  [26 bmp] 26 bmp Vt Set:  [580 mL] 580 mL PEEP:  [5 cmH20] 5 cmH20 Plateau Pressure:  [16 cmH20-21 cmH20] 17 cmH20   Intake/Output Summary (Last 24 hours) at 07/04/2023 0747 Last data filed at 07/04/2023 4696 Gross per 24 hour  Intake 3519.16 ml  Output 1425 ml  Net 2094.16 ml   Filed Weights   06/30/23 0500 07/01/23 0350 07/02/23 0438  Weight: 83.3 kg 83.7 kg 86.4 kg    Examination: No distress R hemiplegia stable Vent mechanics a bit better Moving purposefully on left Abd mildly protuberant, hypoactive BS  WBC up Glucoses up Cr up slightly BUN up too: likely steroid effect Low grade fevers despite tylenol   Assessment & Plan:   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  Residual R hemiplegia but exam was improving per notes. COPD hx, GOLD D, frequent exacerbations Recurrent resp failure, Klebsiella PNA- completing course of ceftriaxone  Hx HTN, CHF, Parkinson's, DM Constipation- discussing bowel regimen with pharmD  - Stop steroids - Continue triple neb therapy - Ongoing GOC discussions, if fails another extubation trial needs trach and vent/SNF - May  benefit from [facility] referral and ongoing discussions there; PMT to follow up with family - Strengthen bowel regimen - RE fever workup; Pct unremarkable, will check LE duplex - Complete 7 days ceftriaxone  - Dispo pending PMT/family/TOC discussions - Will follow  Best practice (evaluated daily):  Diet/type: NPO; EN per RD DVT prophylaxis:  start VTE  Pressure ulcer(s): per nursing documentation GI  prophylaxis: PPI Lines: N/A Foley:  N/A Code Status:  full code Last date of multidisciplinary goals of care discussion: 5/12  32 min cc time Ardelle Kos MD PCCM

## 2023-07-04 NOTE — Progress Notes (Signed)
 Inpatient Rehab Admissions Coordinator:    Pt. Remains on vent, not appropriate for CIR admit.   Wandalee Gust, MS, CCC-SLP Rehab Admissions Coordinator  [phone] (celll) [phone] (office)

## 2023-07-04 NOTE — Progress Notes (Signed)
 Palliative:  HPI:  78 y.o. 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 S/P L M2 thrombectomy and L ICA stent placement. 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 Intubated emergently 5/11.   I met today at Rickey Baker's bedside along with daughter Rickey Baker and her husband Rickey Baker. We were able to get Rickey Baker wife Rickey Baker on over the phone. Rickey Baker is home with a fever currently. We reviewed together Rickey Baker's condition. They met with PCCM and stroke physician late yesterday. They tell me that they have agreed to proceed with tracheostomy if needed. Rickey Baker shares concern that he declined so quickly. I reviewed with her the risk of aspiration in setting of stroke and higher risk of decline in the setting of lung disease as well as Parkinson's disease (although Parkinson's disease was very mild per family report).   We reviewed that trach/PEG would give us  time to see how he does. I encourage them to have ongoing and open conversations about his quality of life and if continuing this treatment course is what he would want. We reviewed that trach/PEG would provide him with resources and support for airway and nutrition but does not reverse the stroke of underlying health conditions that are expected to progress as well. We discussed that he would continue to high risk for further decline and complications. We discussed risk of recurrent infections, skin breakdown, and ongoing aspiration risk.   Rickey Baker shares that she knows he would "want to fight." I shared with her that the interventions we are providing will help him to do as well as possible. I reviewed that I often meet with patients that have strong motivation to improve and "fight" but unless the physical body is medically allowing for this improvement sometimes  people can become very frustrated and depressed. I further encouraged considering his quality of life as we move forward. [name] shares that she [facility] with [facility] and [facility] and no records of Advance Directive to assist with decisions. I provided [name] with Hard Choices booklet and will email electronic copy for her to share with family as well.   All questions/concerns addressed. Therapeutic listening provided. Emotional support provided.   Exam: Eyes open and tracking. No distress. HR RRR. Tolerating vent. R hemiplegia. Abd bloated. R hemiplegia.   Plan: - Full code, full scope - Family agrees with trach/PEG if indicated - Time for outcomes   55 min  [name], NP Palliative Medicine Team Pager [phone] (Please see [email or web address] for schedule) Team Phone [phone]

## 2023-07-04 NOTE — Progress Notes (Signed)
 OT Cancellation Note  Patient Details Name: Rickey Baker MRN: [id number] DOB: 12-Mar-1945   Cancelled Treatment:    Reason Eval/Treat Not Completed: Patient not medically ready (intubated 5.11. RN asks to hold today and check back. Will follow up as pt medically ready.)  [name] [name], OTD, OTR/L [facility] Acute Rehabilitation Office: [phone]   [name] 07/04/2023, 4:07 PM

## 2023-07-05 ENCOUNTER — Inpatient Hospital Stay (HOSPITAL_COMMUNITY)

## 2023-07-05 DIAGNOSIS — I6522 Occlusion and stenosis of left carotid artery: Secondary | ICD-10-CM | POA: Diagnosis not present

## 2023-07-05 DIAGNOSIS — I63522 Cerebral infarction due to unspecified occlusion or stenosis of left anterior cerebral artery: Secondary | ICD-10-CM | POA: Diagnosis not present

## 2023-07-05 DIAGNOSIS — K5900 Constipation, unspecified: Secondary | ICD-10-CM | POA: Diagnosis not present

## 2023-07-05 DIAGNOSIS — I63512 Cerebral infarction due to unspecified occlusion or stenosis of left middle cerebral artery: Secondary | ICD-10-CM | POA: Diagnosis not present

## 2023-07-05 DIAGNOSIS — J449 Chronic obstructive pulmonary disease, unspecified: Secondary | ICD-10-CM | POA: Diagnosis not present

## 2023-07-05 DIAGNOSIS — J150 Pneumonia due to Klebsiella pneumoniae: Secondary | ICD-10-CM | POA: Diagnosis not present

## 2023-07-05 DIAGNOSIS — I779 Disorder of arteries and arterioles, unspecified: Secondary | ICD-10-CM | POA: Diagnosis not present

## 2023-07-05 LAB — GLUCOSE, CAPILLARY
Glucose-Capillary: 142 mg/dL — ABNORMAL HIGH (ref 70–99)
Glucose-Capillary: 183 mg/dL — ABNORMAL HIGH (ref 70–99)
Glucose-Capillary: 220 mg/dL — ABNORMAL HIGH (ref 70–99)
Glucose-Capillary: 153 mg/dL — ABNORMAL HIGH (ref 70–99)
Glucose-Capillary: 157 mg/dL — ABNORMAL HIGH (ref 70–99)

## 2023-07-05 LAB — BASIC METABOLIC PANEL WITH GFR
Anion gap: 9 (ref 5–15)
BUN: 95 mg/dL — ABNORMAL HIGH (ref 8–23)
CO2: 26 mmol/L (ref 22–32)
Calcium: 8.5 mg/dL — ABNORMAL LOW (ref 8.9–10.3)
Chloride: 111 mmol/L (ref 98–111)
Creatinine, Ser: 1.35 mg/dL — ABNORMAL HIGH (ref 0.61–1.24)
GFR, Estimated: 54 mL/min — ABNORMAL LOW (ref >60)
Glucose, Bld: 188 mg/dL — ABNORMAL HIGH (ref 70–99)
Potassium: 5.3 mmol/L — ABNORMAL HIGH (ref 3.5–5.1)
Sodium: 146 mmol/L — ABNORMAL HIGH (ref 135–145)

## 2023-07-05 LAB — CBC
HCT: 40.7 % (ref 39.0–52.0)
Hemoglobin: 13.2 g/dL (ref 13.0–17.0)
MCH: 32.8 pg (ref 26.0–34.0)
MCHC: 32.4 g/dL (ref 30.0–36.0)
MCV: 101 fL — ABNORMAL HIGH (ref 80.0–100.0)
Platelets: 253 10*3/uL (ref 150–400)
RBC: 4.03 MIL/uL — ABNORMAL LOW (ref 4.22–5.81)
RDW: 12.7 % (ref 11.5–15.5)
WBC: 21.1 10*3/uL — ABNORMAL HIGH (ref 4.0–10.5)
nRBC: 0 % (ref 0.0–0.2)

## 2023-07-05 MED ORDER — VANCOMYCIN HCL 1.25 G IV SOLR
1250.0000 mg | INTRAVENOUS | Status: DC
  Filled 2023-07-05: qty 25

## 2023-07-05 MED ORDER — METHYLNALTREXONE BROMIDE 12 MG/0.6ML ~~LOC~~ SOLN
12.0000 mg | Freq: Once | SUBCUTANEOUS | Status: DC
  Filled 2023-07-05: qty 0.6

## 2023-07-05 MED ORDER — SODIUM ZIRCONIUM CYCLOSILICATE 10 G PO PACK
10.0000 g | PACK | Freq: Once | ORAL | Status: AC
  Administered 2023-07-05: 10 g
  Filled 2023-07-05: qty 1

## 2023-07-05 MED ORDER — VANCOMYCIN HCL 1750 MG/350ML IV SOLN
1750.0000 mg | Freq: Once | INTRAVENOUS | Status: AC
Start: 2023-07-05 — End: 2023-07-05
  Administered 2023-07-05: 1750 mg via INTRAVENOUS
  Filled 2023-07-05: qty 350

## 2023-07-05 MED ORDER — PIPERACILLIN-TAZOBACTAM 3.375 G IVPB
3.3750 g | Freq: Three times a day (TID) | INTRAVENOUS | Status: DC
  Administered 2023-07-05 – 2023-07-08 (×10): 3.375 g via INTRAVENOUS
  Filled 2023-07-05 (×10): qty 50

## 2023-07-05 MED ORDER — METOCLOPRAMIDE HCL 5 MG/ML IJ SOLN
5.0000 mg | Freq: Three times a day (TID) | INTRAMUSCULAR | Status: AC
  Administered 2023-07-05 – 2023-07-07 (×9): 5 mg via INTRAVENOUS
  Filled 2023-07-05 (×9): qty 2

## 2023-07-05 NOTE — Progress Notes (Signed)
 Full note to follow.  - Start fent again - Check CXR, KUB - F/u am labs - LTACH

## 2023-07-05 NOTE — Progress Notes (Signed)
 PT Cancellation Note  Patient Details Name: Rickey Baker MRN: [id number] DOB: 22-Oct-1945   Cancelled Treatment:    Reason Eval/Treat Not Completed: Patient not medically ready (discussed with RN; pt remains intubated and sedated. Will sign off. Please re-consult if status changes).  Verdia Glad, PT, DPT Acute Rehabilitation Services Office [phone]    Claria Crofts 07/05/2023, 12:25 PM

## 2023-07-05 NOTE — Progress Notes (Signed)
 Inpatient Rehab Admissions Coordinator:    Pt. Remains on vent. CIR to sign off.   Wandalee Gust, MS, CCC-SLP Rehab Admissions Coordinator  [phone] (celll) [phone] (office)

## 2023-07-05 NOTE — Progress Notes (Addendum)
 STROKE TEAM PROGRESS NOTE   INTERIM HISTORY/SUBJECTIVE  Patient intubated, sedated with Precedex.  Requiring pressor support with neo at 10.  Patient seems less responsive than yesterday's exam; aphasia, no movement BLE, slight withdraw BUE with weaker R.   OBJECTIVE  CBC    Component Value Date/Time   WBC 21.1 (H) 07/05/2023 0617   RBC 4.03 (L) 07/05/2023 0617   HGB 13.2 07/05/2023 0617   HCT 40.7 07/05/2023 0617   PLT 253 07/05/2023 0617   MCV 101.0 (H) 07/05/2023 0617   MCH 32.8 07/05/2023 0617   MCHC 32.4 07/05/2023 0617   RDW 12.7 07/05/2023 0617   LYMPHSABS 0.7 06/27/2023 1954   MONOABS 0.8 06/27/2023 1954   EOSABS 0.2 06/27/2023 1954   BASOSABS 0.1 06/27/2023 1954    BMET    Component Value Date/Time   NA 146 (H) 07/05/2023 0617   K 5.3 (H) 07/05/2023 0617   CL 111 07/05/2023 0617   CO2 26 07/05/2023 0617   GLUCOSE 188 (H) 07/05/2023 0617   BUN 95 (H) 07/05/2023 0617   CREATININE 1.35 (H) 07/05/2023 0617   CALCIUM  8.5 (L) 07/05/2023 0617   GFRNONAA 54 (L) 07/05/2023 0617    IMAGING past 24 hours DG Abd 1 View Result Date: 07/05/2023 CLINICAL DATA:  Nasogastric tube placement. EXAM: ABDOMEN - 1 VIEW COMPARISON:  None Available. FINDINGS: Distal tip of feeding tube is seen in expected position of distal stomach. IMPRESSION: Feeding tube tip seen in expected position of distal stomach. Electronically Signed   By: Rosalene Colon M.D.   On: 07/05/2023 07:59   DG Chest Port 1 View Result Date: 07/05/2023 CLINICAL DATA:  Endotracheal tube placement. EXAM: PORTABLE CHEST 1 VIEW COMPARISON:  Jul 02, 2023. FINDINGS: Stable cardiomediastinal silhouette. Endotracheal and feeding tubes are in grossly good position. Stable probable bilateral lung scarring is noted with possible emphysematous disease. No definite acute abnormality seen. Bony thorax is unremarkable. IMPRESSION: No active disease. Electronically Signed   By: Rosalene Colon M.D.   On: 07/05/2023 07:59   VAS US   LOWER EXTREMITY VENOUS (DVT) Result Date: 07/04/2023  Lower Venous DVT Study Patient Name:  Rickey Baker  Date of Exam:   07/04/2023 Medical Rec #: [id number]       Accession #:    [id number] Date of Birth: 10/28/1945       Patient Gender: M Patient Age:   78 years Exam Location:  [facility] Procedure:      VAS US  LOWER EXTREMITY VENOUS (DVT) Referring Phys: Felton Hough --------------------------------------------------------------------------------  Indications: Edema.  Limitations: Patient positioning. Performing Technologist: Aloma Arrant RVS  Examination Guidelines: A complete evaluation includes B-mode imaging, spectral Doppler, color Doppler, and power Doppler as needed of all accessible portions of each vessel. Bilateral testing is considered an integral part of a complete examination. Limited examinations for reoccurring indications may be performed as noted. The reflux portion of the exam is performed with the patient in reverse Trendelenburg.  +---------+---------------+---------+-----------+----------+--------------+ RIGHT    CompressibilityPhasicitySpontaneityPropertiesThrombus Aging +---------+---------------+---------+-----------+----------+--------------+ CFV      Full           Yes      Yes                                 +---------+---------------+---------+-----------+----------+--------------+ SFJ      Full                                                        +---------+---------------+---------+-----------+----------+--------------+  FV Prox  Full                                                        +---------+---------------+---------+-----------+----------+--------------+ FV Mid   Full                                                        +---------+---------------+---------+-----------+----------+--------------+ FV DistalFull                                                         +---------+---------------+---------+-----------+----------+--------------+ PFV      Full                                                        +---------+---------------+---------+-----------+----------+--------------+ POP      Full           Yes      Yes                                 +---------+---------------+---------+-----------+----------+--------------+ PTV      Full                                                        +---------+---------------+---------+-----------+----------+--------------+ PERO     Full                                                        +---------+---------------+---------+-----------+----------+--------------+   +---------+---------------+---------+-----------+----------+--------------+ LEFT     CompressibilityPhasicitySpontaneityPropertiesThrombus Aging +---------+---------------+---------+-----------+----------+--------------+ CFV      Full           Yes      Yes                                 +---------+---------------+---------+-----------+----------+--------------+ SFJ      Full                                                        +---------+---------------+---------+-----------+----------+--------------+ FV Prox  Full                                                        +---------+---------------+---------+-----------+----------+--------------+  FV Mid   Full                                                        +---------+---------------+---------+-----------+----------+--------------+ FV DistalFull                                                        +---------+---------------+---------+-----------+----------+--------------+ PFV      Full                                                        +---------+---------------+---------+-----------+----------+--------------+ POP      Full           Yes      Yes                                  +---------+---------------+---------+-----------+----------+--------------+ PTV      Full                                                        +---------+---------------+---------+-----------+----------+--------------+ PERO     Full                                                        +---------+---------------+---------+-----------+----------+--------------+     Summary: BILATERAL: - No evidence of deep vein thrombosis seen in the lower extremities, bilaterally. -No evidence of popliteal cyst, bilaterally.   *See table(s) above for measurements and observations. Electronically signed by Delaney Fearing on 07/04/2023 at 4:07:54 PM.    Final      Vitals:   07/05/23 0700 07/05/23 0739 07/05/23 0742 07/05/23 0800  BP: 99/69 92/75  (!) 85/64  Pulse: 76 91 71 67  Resp: (!) 27 (!) 28 (!) 25 (!) 26  Temp:    (!) 101.1 F (38.4 C)  TempSrc:    Axillary  SpO2: 95% 96% 96% 94%  Weight:      Height:         PHYSICAL EXAM  Temp:  [99.2 F (37.3 C)-101.1 F (38.4 C)] 101.1 F (38.4 C) (05/14 0800) Pulse Rate:  [65-120] 67 (05/14 0800) Resp:  [16-31] 26 (05/14 0800) BP: (76-146)/(56-121) 85/64 (05/14 0800) SpO2:  [92 %-97 %] 94 % (05/14 0800) FiO2 (%):  [40 %] 40 % (05/14 0742) Weight:  [83 kg] 83 kg (05/14 0500)  General - Well nourished, well developed, intubated on precedex. Cardiovascular - Regular rate and rhythm. Resp - Ventilated, on PS  Neuro -  Intubated with Precedex as sedation.  Eyes do open to voice eventually, but does take longer than previous exams.  Patient does not  follow commands.  With forced eye opening blinks to threat on left.  Corneal reflex present.  Unable to test facial symmetry due to ET tube.  Tongue protrusion not cooperative.  Gag and cough present. No spontaneous movement seen.  Does withdraw bilateral upper extremities with left withdrawal stronger than right.  No withdrawal seen of bilateral lower extremities.  Sensation coordination, gait not  tested.   ASSESSMENT/PLAN  [name] is a 78 y.o. male with history of hypertension, hyperlipidemia, Parkinson's disease who presents with right-sided weakness and aphasia .  He was found to have left carotid and left MCA occlusion and was given TNK.  He was taken for mechanical thrombectomy and underwent rescue left ICA stenting.  He is on Rocephin  for aspiration pneumonia.  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 ASPECTS 10.   CTA head & neck  Occluded left ICA and left MCA. Approximately 40% stenosis of the proximal right ICA.  Moderate stenosis of right VA.  Moderate right brachiocephalic and left subclavian artery S/p IR with TICI3 and left ICA stenting MRI Acute infarct affecting much of the left caudate and lentiform nuclei. Patchy acute cortically-based infarcts scattered within the frontal, parietal and temporal lobes on the left (MCA and ACA vascular territories). Additional small acute infarct along the atrium of the left lateral ventricle. Punctate acute cortical infarct within the perirolandic medial right frontoparietal lobes.  2D Echo EF 35-40%  LDL 130 HgbA1c 6.2 VTE prophylaxis - Lovenox  No antithrombotic prior to admission, continue aspirin  81 mg daily and Brilinta  (ticagrelor ) 90 mg bid for ICA stent Therapy recommendations:  LTACH Disposition: Pending  Respiratory insufficiency Aspiration Fever, worsening PCCM consulted for airway management Extubated 5/7  Reintubated 5/11 Vent management per CCM May need trach  Tmax 102.3-> 100.7->100.4->102.6 Rocephin --> Now on Vanc and zosyn  Hx of hypertension hypotension BP low 5/11-5/12 Concerning for sepsis CCM on board Off neo Long-term BP goal normotensive  Hyperlipidemia Home meds:   Zetia 5 and Lipitor 40  LDL 130, goal < 70 Now on atorvastatin  80mg  Continue statin at discharge  Hyperglycemia A1c 6.2, goal less than 7.0 hyperglycemia Now on insulin  7U bid along with sliding scale  Dysphagia SLP on board NPO Continue tube feeding May need a PEG tube  Hypernatremia AKI Na 146--149--151--150--146 Creatinine 1.07--1.33--1.50--1.35 Continue tube feeding Daily BMP monitoring  Other Stroke Risk Factors Obesity, Body mass index is 26.63 kg/m., BMI >/= 30 associated with increased stroke risk, recommend weight loss, diet and exercise as appropriate  Advanced age  Other Active Problems Parkinson's disease Sinemet  resumed Gabapentin, Hydroxyzine, Ropinirole , Topiramate - on hold until med rec is done KUB completed due to high risk of ileus negative  Hospital day # 8   Pt seen by Neuro NP/APP and later by MD. Note/plan to be edited by MD as needed.    Audrene Lease, DNP [facility] Please use AMION for contact information & EPIC for messaging.  ATTENDING NOTE: I reviewed above note and agree with the assessment and plan. Pt was seen and examined.   No family at bedside.  Patient still intubated, seems more lethargic than yesterday.  Eyes closed, however open with voice, otherwise neuro unchanged.  Patient still have a fever, Rocephin  changed to vancomycin and Zosyn.  Continued on DAPT and statin.  Pending LTAC placement.  Palliative care still on board.  For detailed assessment and plan, please refer to above as I have made changes wherever appropriate.   Neurology will sign off. Please call with questions. Pt will follow up with stroke clinic NP at [facility] in about 4 weeks after discharge. Thanks for the consult.   Consuelo Denmark, MD PhD Stroke Neurology 07/05/2023 6:00 PM    To contact Stroke Continuity provider, please refer to [email or web address]. After hours, contact General Neurology

## 2023-07-05 NOTE — Progress Notes (Signed)
 Pharmacy Antibiotic Note  Rickey Baker is a 78 y.o. male admitted on 06/27/2023 with AIS s/p TNK and thrombectomy.  Patient has been on Rocephin  for Kleb aerogenes PNA.  Fever persists with leukocytosis and worsening fever, so Pharmacy has been consulted to broaden antibiotics to vancomycin and Zosyn.  Renal function improving, Tmax 101.1, WBC up to 21.1. CXR no active disease  Plan: Vanc 1750mg  IV x 1, then 1250mg  IV Q24H for AUC 479 using SCr 1.35 Zosyn EID 3.375gm IV Q8H Monitor renal fxn, micro data, clinical progress, vanc levels if indicated   Height: 5' 9.5" (176.5 cm) Weight: 83 kg (182 lb 15.7 oz) IBW/kg (Calculated) : 71.85  Temp (24hrs), Avg:100.1 F (37.8 C), Min:99.2 F (37.3 C), Max:101.1 F (38.4 C)  Recent Labs  Lab 07/01/23 0754 07/02/23 0716 07/03/23 0606 07/04/23 0551 07/05/23 0617  WBC 17.1* 14.9* 15.9* 18.0* 21.1*  CREATININE 1.01 1.07 1.33* 1.50* 1.35*    Estimated Creatinine Clearance: 46.6 mL/min (A) (by C-G formula based on SCr of 1.35 mg/dL (H)).    Allergies  Allergen Reactions   Indomethacin Nausea And Vomiting and Other (See Comments)    Other reaction(s): Blurred vision Ear ringing    Rocephin  5/8 >> 5/14 Vanc 5/14 >> Zosyn 5/14 >>   5/6 MRSA PCR - negative 5/8 TA - Kleb aerogenes (pan sensitive) 5/12 RVP - negative 5/14 TA -  5/14 MRSA PCR -   Woodward Klem D. Marikay Show, PharmD, BCPS, BCCCP 07/05/2023, 9:18 AM

## 2023-07-05 NOTE — Progress Notes (Signed)
 NAME:  [facility], MRN:  [id number], DOB:  Jan 23, 1946, LOS: 8 ADMISSION DATE:  06/27/2023, CONSULTATION DATE:  06/27/23 REFERRING MD:  [facility] CHIEF COMPLAINT:  Vent Management   History of Present Illness:  Pt is encephelopathic; therefore, this HPI is obtained from chart review. Rickey Baker is a 78 y.o. male who has a PMH as below clued but not limited to hypertension, hyperlipidemia, Parkinson disease.  He presented to [facility] on 06/27/2023 with right-sided weakness and aphasia.  He was in his normal state of health earlier in the evening when his wife helped him lie down for a nap around 5 PM.  When she woke up later, she found that he was slumped to the side and unable to talk.  She called 911 and he was brought to the ED as a code stroke.  CT revealed no hemorrhage, CTA confirmed a left ICA/MCA occlusion.  He did receive TNKase  at 2004.  He was then taken to neuro IR where he had a left M2 thrombectomy, L ICA stent placement with angioplasty performed.  Just prior to the procedure he apparently had a vomiting episode requiring suctioning therefore, following the procedure, he remained intubated and was transferred to the ICU PCCM was subsequently called in consultation for vent management. Per CRNA, he was awake during vomiting episode and had a strong cough during suctioning and there was NOT any concern for aspiration event.  Pertinent  Medical History:  has Acute hypoxic respiratory failure (HCC); COPD with acute exacerbation (HCC); Stroke (cerebrum) (HCC); Hypertension associated with diabetes (HCC); and Malnutrition of moderate degree on their problem list.  Significant Hospital Events: Including procedures, antibiotic start and stop dates in addition to other pertinent events   5/6 admit, to [facility] for left M2 thrombectomy, L ICA stent placement with angioplasty.  MRI s 5/7 CTH stable, increasing congestion, aspiration coverage started, diuresis 5/11 obtunded and reintubated, pressors  after intubation 5/12 family meeting #1  Interim History / Subjective:  A little more air hungry this am on AC after fent turned off. Temp drifted up.  Objective:  Blood pressure (!) 85/64, pulse 67, temperature (!) 101.1 F (38.4 C), temperature source Axillary, resp. rate (!) 26, height 5' 9.5" (1.765 m), weight 83 kg, SpO2 94%.    Vent Mode: CPAP;PSV FiO2 (%):  [40 %] 40 % Set Rate:  [26 bmp] 26 bmp Vt Set:  [580 mL] 580 mL PEEP:  [5 cmH20] 5 cmH20 Pressure Support:  [5 cmH20-10 cmH20] 10 cmH20 Plateau Pressure:  [15 cmH20] 15 cmH20   Intake/Output Summary (Last 24 hours) at 07/05/2023 0846 Last data filed at 07/05/2023 4098 Gross per 24 hour  Intake 3682.06 ml  Output 2725 ml  Net 957.06 ml   Filed Weights   07/02/23 0438 07/04/23 0703 07/05/23 0500  Weight: 86.4 kg 81.4 kg 83 kg    Examination: Looks more air hungry Rhonci bilaterally Thick secretions Does better on PS than AC Ext warm Moves L>R purposefully  WBC going up Temp going up Cr stable Sodium improved K up slightly  Assessment & Plan:   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  Residual R hemiplegia but exam was improving per notes.  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 COPD hx, GOLD D, frequent exacerbations Recurrent resp failure, Klebsiella PNA- completing course of ceftriaxone  Hx HTN, CHF, Parkinson's, DM Worsening fevers, secretions- broaden abx Constipation- discussing bowel regimen with pharmD  -  Dose of lokelma - Vanc/zosyn, check tracheal aspirate, CXR, follow fever curve - Relistor, check KUB, Parkinson's has high co-incidence of ileus - GDMT for stroke on DC: atovastatin, brilinta , aspirin  - Continue PTA Parkinson's meds - Continue bronchodilators - Stable for LTACH for ongoing vent wean  Best practice (evaluated daily):  Diet/type: NPO; EN per RD DVT prophylaxis:  start VTE  Pressure ulcer(s): per nursing  documentation GI prophylaxis: PPI Lines: N/A Foley:  N/A Code Status:  full code Last date of multidisciplinary goals of care discussion: family updated daily (daughter)  33 min cc time [facility] Kos MD PCCM

## 2023-07-05 NOTE — Progress Notes (Signed)
 Palliative:  ***  Yong Channel, NP Palliative Medicine Team Pager [phone] (Please see [email or web address] for schedule) Team Phone [phone]

## 2023-07-06 ENCOUNTER — Inpatient Hospital Stay (HOSPITAL_COMMUNITY)

## 2023-07-06 DIAGNOSIS — J449 Chronic obstructive pulmonary disease, unspecified: Secondary | ICD-10-CM | POA: Diagnosis not present

## 2023-07-06 DIAGNOSIS — Z515 Encounter for palliative care: Secondary | ICD-10-CM | POA: Diagnosis not present

## 2023-07-06 DIAGNOSIS — Z7189 Other specified counseling: Secondary | ICD-10-CM | POA: Diagnosis not present

## 2023-07-06 DIAGNOSIS — K5900 Constipation, unspecified: Secondary | ICD-10-CM | POA: Diagnosis not present

## 2023-07-06 DIAGNOSIS — I6522 Occlusion and stenosis of left carotid artery: Secondary | ICD-10-CM | POA: Diagnosis not present

## 2023-07-06 DIAGNOSIS — J150 Pneumonia due to Klebsiella pneumoniae: Secondary | ICD-10-CM | POA: Diagnosis not present

## 2023-07-06 DIAGNOSIS — I639 Cerebral infarction, unspecified: Secondary | ICD-10-CM | POA: Diagnosis not present

## 2023-07-06 LAB — CBC
HCT: 38 % — ABNORMAL LOW (ref 39.0–52.0)
Hemoglobin: 12.1 g/dL — ABNORMAL LOW (ref 13.0–17.0)
MCH: 32.7 pg (ref 26.0–34.0)
MCHC: 31.8 g/dL (ref 30.0–36.0)
MCV: 102.7 fL — ABNORMAL HIGH (ref 80.0–100.0)
Platelets: 210 10*3/uL (ref 150–400)
RBC: 3.7 MIL/uL — ABNORMAL LOW (ref 4.22–5.81)
RDW: 12.9 % (ref 11.5–15.5)
WBC: 22 10*3/uL — ABNORMAL HIGH (ref 4.0–10.5)
nRBC: 0 % (ref 0.0–0.2)

## 2023-07-06 LAB — BASIC METABOLIC PANEL WITH GFR
Anion gap: 8 (ref 5–15)
BUN: 97 mg/dL — ABNORMAL HIGH (ref 8–23)
CO2: 28 mmol/L (ref 22–32)
Calcium: 8 mg/dL — ABNORMAL LOW (ref 8.9–10.3)
Chloride: 112 mmol/L — ABNORMAL HIGH (ref 98–111)
Creatinine, Ser: 1.72 mg/dL — ABNORMAL HIGH (ref 0.61–1.24)
GFR, Estimated: 40 mL/min — ABNORMAL LOW (ref >60)
Glucose, Bld: 158 mg/dL — ABNORMAL HIGH (ref 70–99)
Potassium: 4.8 mmol/L (ref 3.5–5.1)
Sodium: 148 mmol/L — ABNORMAL HIGH (ref 135–145)

## 2023-07-06 LAB — GLUCOSE, CAPILLARY
Glucose-Capillary: 237 mg/dL — ABNORMAL HIGH (ref 70–99)
Glucose-Capillary: 175 mg/dL — ABNORMAL HIGH (ref 70–99)
Glucose-Capillary: 148 mg/dL — ABNORMAL HIGH (ref 70–99)
Glucose-Capillary: 175 mg/dL — ABNORMAL HIGH (ref 70–99)
Glucose-Capillary: 181 mg/dL — ABNORMAL HIGH (ref 70–99)
Glucose-Capillary: 152 mg/dL — ABNORMAL HIGH (ref 70–99)
Glucose-Capillary: 127 mg/dL — ABNORMAL HIGH (ref 70–99)

## 2023-07-06 LAB — MAGNESIUM: Magnesium: 3.1 mg/dL — ABNORMAL HIGH (ref 1.7–2.4)

## 2023-07-06 LAB — URINALYSIS, ROUTINE W REFLEX MICROSCOPIC
Bacteria, UA: NONE SEEN
Bilirubin Urine: NEGATIVE
Glucose, UA: NEGATIVE mg/dL
Ketones, ur: NEGATIVE mg/dL
Leukocytes,Ua: NEGATIVE
Nitrite: NEGATIVE
Protein, ur: 30 mg/dL — AB
Specific Gravity, Urine: 1.021 (ref 1.005–1.030)
pH: 5 (ref 5.0–8.0)

## 2023-07-06 LAB — PROCALCITONIN: Procalcitonin: 1.06 ng/mL

## 2023-07-06 LAB — AMMONIA: Ammonia: 38 umol/L — ABNORMAL HIGH (ref 9–35)

## 2023-07-06 MED ORDER — VANCOMYCIN HCL IN DEXTROSE 1-5 GM/200ML-% IV SOLN
1000.0000 mg | INTRAVENOUS | Status: DC
  Administered 2023-07-06 – 2023-07-07 (×2): 1000 mg via INTRAVENOUS
  Filled 2023-07-06 (×3): qty 200

## 2023-07-06 MED ORDER — PROPOFOL 1000 MG/100ML IV EMUL
5.0000 ug/kg/min | INTRAVENOUS | Status: DC
  Administered 2023-07-06: 5 ug/kg/min via INTRAVENOUS
  Administered 2023-07-07 (×2): 10 ug/kg/min via INTRAVENOUS
  Filled 2023-07-06 (×3): qty 100

## 2023-07-06 NOTE — Progress Notes (Signed)
 NAME:  [facility], MRN:  [id number], DOB:  06/17/1945, LOS: 9 ADMISSION DATE:  06/27/2023, CONSULTATION DATE:  06/27/23 REFERRING MD:  Alecia Ames CHIEF COMPLAINT:  Vent Management   History of Present Illness:  Pt is encephelopathic; therefore, this HPI is obtained from chart review. Rickey Baker is a 78 y.o. male who has a PMH as below clued but not limited to hypertension, hyperlipidemia, Parkinson disease.  He presented to [facility] ED on 06/27/2023 with right-sided weakness and aphasia.  He was in his normal state of health earlier in the evening when his wife helped him lie down for a nap around 5 PM.  When she woke up later, she found that he was slumped to the side and unable to talk.  She called 911 and he was brought to the ED as a code stroke.  CT revealed no hemorrhage, CTA confirmed a left ICA/MCA occlusion.  He did receive TNKase  at 2004.  He was then taken to neuro IR where he had a left M2 thrombectomy, L ICA stent placement with angioplasty performed.  Just prior to the procedure he apparently had a vomiting episode requiring suctioning therefore, following the procedure, he remained intubated and was transferred to the ICU PCCM was subsequently called in consultation for vent management. Per CRNA, he was awake during vomiting episode and had a strong cough during suctioning and there was NOT any concern for aspiration event.  Pertinent  Medical History:  has Acute hypoxic respiratory failure (HCC); COPD with acute exacerbation (HCC); Stroke (cerebrum) (HCC); Hypertension associated with diabetes (HCC); and Malnutrition of moderate degree on their problem list.  Significant Hospital Events: Including procedures, antibiotic start and stop dates in addition to other pertinent events   5/6 admit, to [facility] for left M2 thrombectomy, L ICA stent placement with angioplasty.  MRI s 5/7 CTH stable, increasing congestion, aspiration coverage started, diuresis 5/11 obtunded and reintubated, pressors  after intubation 5/12 family meeting #1 5/15: change in neuro exam - stat head CT   Interim History / Subjective:  WBC and fever curve going up - waiting on resp culture. Covering with antibiotics. Neuro exam different than yesterday, not moving any extremities. Going to down for stat CT head. Neuro aware.   Objective:  Blood pressure 99/69, pulse 72, temperature (!) 101.8 F (38.8 C), temperature source Axillary, resp. rate (!) 21, height 5\' 10"  (1.778 m), weight 83 kg, SpO2 97%.    Vent Mode: CPAP;PSV FiO2 (%):  [40 %] 40 % Set Rate:  [26 bmp] 26 bmp Vt Set:  [580 mL] 580 mL PEEP:  [5 cmH20] 5 cmH20 Pressure Support:  [10 cmH20] 10 cmH20   Intake/Output Summary (Last 24 hours) at 07/06/2023 0828 Last data filed at 07/06/2023 0600 Gross per 24 hour  Intake 2777.57 ml  Output 1900 ml  Net 877.57 ml   Filed Weights   07/02/23 0438 07/04/23 0703 07/05/23 0500  Weight: 86.4 kg 81.4 kg 83 kg    Examination: Older male, laying in bed, critically ill appearing  Pupils equal, sluggish, ncat, mucous membranes moist  Open eyes but does not track. +corneal reflex. Does not move any extremity to pain. No posturing.  Vented, rhochi bilaterally, thick secretions  Extremities warm, no pitting edema  Abdomen rounded, soft   Assessment & Plan:   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  Residual R hemiplegia but exam was improving per notes.  Culprit thought  to be large vessel disease. COPD hx, GOLD D, frequent exacerbations Recurrent resp failure, Klebsiella PNA Hx HTN, CHF, Parkinson's, DM Worsening fevers, secretions- on coverage Constipation- discussing bowel regimen with pharmD  - stat CT head this morning for change in exam  - con't vanc/zosyn, send tracheal aspirate when have sample, send UA  - follow wbc, fever, culture data  - con't relistor, KUB yesterday without ileus  - GDMT for stroke: statin, brilinta ,  asa  - con't PTA Parkinson's meds  - bronchodilators   Best practice (evaluated daily):  Diet/type: tubefeeds and NPO; EN per RD DVT prophylaxis:  LMWH  Pressure ulcer(s): per nursing documentation GI prophylaxis: PPI Lines: N/A Foley:  N/A Code Status:  full code Last date of multidisciplinary goals of care discussion: daughter updated   35 min cc time Lalla Pill, PA-C [facility] 07/06/23 11:48 AM  Please see [email or web address] for pager details.  From 7A-7P if no response, please call [phone] After hours, please call ELink [phone]

## 2023-07-06 NOTE — Progress Notes (Signed)
 OT Cancellation Note  Patient Details Name: [facility] MRN: [id number] DOB: January 09, 1946   Cancelled Treatment:    Reason Eval/Treat Not Completed: Patient not medically ready- pt remains intubated and not following commands. Will sign off. Please re-consult if status changes.   Bary Boss, OT Acute Rehabilitation Services Office [phone] Secure Chat Preferred    Fredrich Jefferson 07/06/2023, 8:09 AM

## 2023-07-06 NOTE — Plan of Care (Signed)
  Problem: Education: Goal: Knowledge of disease or condition will improve Outcome: Progressing Goal: Knowledge of secondary prevention will improve (MUST DOCUMENT ALL) Outcome: Progressing Goal: Knowledge of patient specific risk factors will improve (DELETE if not current risk factor) Outcome: Progressing   Problem: Ischemic Stroke/TIA Tissue Perfusion: Goal: Complications of ischemic stroke/TIA will be minimized Outcome: Progressing   Problem: Coping: Goal: Will verbalize positive feelings about self Outcome: Progressing Goal: Will identify appropriate support needs Outcome: Progressing   Problem: Health Behavior/Discharge Planning: Goal: Ability to manage health-related needs will improve Outcome: Progressing Goal: Goals will be collaboratively established with patient/family Outcome: Progressing   Problem: Self-Care: Goal: Ability to participate in self-care as condition permits will improve Outcome: Progressing Goal: Verbalization of feelings and concerns over difficulty with self-care will improve Outcome: Progressing Goal: Ability to communicate needs accurately will improve Outcome: Progressing   Problem: Education: Goal: Knowledge of General Education information will improve Description: Including pain rating scale, medication(s)/side effects and non-pharmacologic comfort measures Outcome: Progressing   Problem: Nutrition: Goal: Risk of aspiration will decrease Outcome: Progressing Goal: Dietary intake will improve Outcome: Progressing   Problem: Health Behavior/Discharge Planning: Goal: Ability to manage health-related needs will improve Outcome: Progressing   Problem: Clinical Measurements: Goal: Ability to maintain clinical measurements within normal limits will improve Outcome: Progressing Goal: Will remain free from infection Outcome: Progressing Goal: Diagnostic test results will improve Outcome: Progressing Goal: Respiratory complications will  improve Outcome: Progressing Goal: Cardiovascular complication will be avoided Outcome: Progressing   Problem: Activity: Goal: Risk for activity intolerance will decrease Outcome: Progressing   Problem: Coping: Goal: Level of anxiety will decrease Outcome: Progressing   Problem: Elimination: Goal: Will not experience complications related to bowel motility Outcome: Progressing Goal: Will not experience complications related to urinary retention Outcome: Progressing   Problem: Pain Managment: Goal: General experience of comfort will improve and/or be controlled Outcome: Progressing

## 2023-07-06 NOTE — Progress Notes (Signed)
 Patient was transported to CT on full vent support and brought back to 4N17 without any complications.

## 2023-07-06 NOTE — TOC Transition Note (Signed)
 Transition of Care [facility]) - Discharge Note   Patient Details  Name: [facility] MRN: [id number] Date of Birth: [birth year]  Transition of Care [facility]) CM/SW Contact:  Jannice Mends, LCSW Phone Number: 07/06/2023, 9:32 AM   Clinical Narrative:    Patient will DC to: [facility] Anticipated DC date: 07/06/23 Family notified: Spouse Transport by: RN   Per MD patient ready for DC to [facility]. RN to call report prior to discharge (Room number is 5E19, accepting MD is Dr. Alta Ast, [phone] ). RN, patient, patient's family, and facility notified of DC.   CSW will sign off for now as social work intervention is no longer needed. Please consult us  again if new needs arise.     Final next level of care: Long Term Acute Care (LTAC) Barriers to Discharge: Barriers Resolved   Patient Goals and CMS Choice   CMS Medicare.gov Compare Post Acute Care list provided to:: Patient Represenative (must comment) (daughter) Choice offered to / list presented to : Adult Children      Discharge Placement                Patient to be transferred to facility by: stretcher   Patient and family notified of of transfer: 07/06/23  Discharge Plan and Services Additional resources added to the After Visit Summary for   In-house Referral: Clinical Social Work   Post Acute Care Choice: Long Term Acute Care (LTAC)                               Social Drivers of Health (SDOH) Interventions SDOH Screenings   Food Insecurity: Patient Unable To Answer (06/29/2023)  Housing: Patient Unable To Answer (06/29/2023)  Transportation Needs: Patient Unable To Answer (06/29/2023)  Utilities: Patient Unable To Answer (06/29/2023)  Social Connections: Unknown (06/29/2023)  Tobacco Use: High Risk (07/03/2023)     Readmission Risk Interventions     No data to display

## 2023-07-06 NOTE — Progress Notes (Signed)
 Afternoon round  Head CT this morning as he was not moving any extremities for attending and myself on exam. Different from yesterday. CT head unchanged. Started moving RUE for neuro MD. Continuing to have fever, increasing white count. Repeated blood cultures. UA negative. Procal slightly up. Known pna. Could be in part neuro. I stopped the precedex in case this was causing part of the symptoms although with leukocytosis correlating..? On propofol  now. Family updated.

## 2023-07-06 NOTE — Progress Notes (Signed)
 SLP Cancellation and Discharge Note  Patient Details Name: [facility] MRN: [id number] DOB: [birth year]   Pt remains intubated. Will sign off and await re-consult if warranted.  [name] L. Beatris Lincoln, MA CCC/SLP Clinical Specialist - Acute Care SLP Acute Rehabilitation Services Office number [phone]        [name] [name] 07/06/2023, 8:26 AM

## 2023-07-06 NOTE — TOC Progression Note (Signed)
 Transition of Care [facility]) - Progression Note    Patient Details  Name: [facility] MRN: [id number] Date of Birth: 11/06/1945  [facility] CM/SW Contact  Jannice Mends, LCSW Phone Number: 07/06/2023, 9:57 AM  Clinical Narrative:    Per MD, holding patient for medical stability. [facility] aware.    Expected Discharge Plan: Long Term Acute Care (LTAC) Barriers to Discharge: Barriers Resolved  Expected Discharge Plan and Services In-house Referral: Clinical Social Work   Post Acute Care Choice: Long Term Acute Care (LTAC) Living arrangements for the past 2 months: Apartment                                       Social Determinants of Health (SDOH) Interventions SDOH Screenings   Food Insecurity: Patient Unable To Answer (06/29/2023)  Housing: Patient Unable To Answer (06/29/2023)  Transportation Needs: Patient Unable To Answer (06/29/2023)  Utilities: Patient Unable To Answer (06/29/2023)  Social Connections: Unknown (06/29/2023)  Tobacco Use: High Risk (07/03/2023)    Readmission Risk Interventions     No data to display

## 2023-07-06 NOTE — Progress Notes (Signed)
 Nutrition Follow-up  DOCUMENTATION CODES:   Non-severe (moderate) malnutrition in context of chronic illness  INTERVENTION:   Tube feeding via Cortrak tube: Osmolite 1.5 @ 50 ml/hr (1200 ml per day)  Prosource TF20 60 ml TID  Provides 2040 kcal, 135 gm protein, 912 ml free water daily  300 ml free water every 4 hours Total free water: 2712 ml   100 mg thiamine  daily via tube   NUTRITION DIAGNOSIS:   Moderate Malnutrition related to chronic illness as evidenced by moderate fat depletion, moderate muscle depletion. Ongoing.   GOAL:   Patient will meet greater than or equal to 90% of their needs Met with TF at goal.   MONITOR:   TF tolerance  REASON FOR ASSESSMENT:    (Cortrak tube)    ASSESSMENT:   Pt with PMH of COPD, HTN, Parkinson's, PTSD, and DM admitted with right-sided weakness and aphasia dx with L IC and MC occlusion s/p TNK and IR for stent.   Pt discussed during ICU rounds and with RN and MD.  Pt remains intubated, MD holding transfer to [facility] for work up on fevers and mental status changes. Pt given dose of Relistor per KUB no ileus. Pt with mental status change and went to head CT.    5/6 - admit; intubated; s/p TNA and IR for L M2 thrombectomy, L ICA stent with angioplasty 5/7 - s/p cortrak placement-gastric; extubated 5/8 - start TF 5/11 - re-intubated    Medications reviewed and include: sinemet  TID, SSI every 4 hours, 7 units semglee BID, reglan 5 mg every 8 hours, MIV with minerals, protonix , miralax , senokot-s,  Precedex Fentanyl   Phenylephrine  @ 20 mcg Relistor x 1 5/14  Labs reviewed:  Na 148 K 5.3 (lokelma x 1) A1C 6.2 CBG's: 148-237   Diet Order:   Diet Order             Diet NPO time specified  Diet effective now                   EDUCATION NEEDS:   Not appropriate for education at this time  Skin:  Skin Assessment: Reviewed RN Assessment (skin tear R elbow)  Last BM:  unknown; abd soft  Height:   Ht  Readings from Last 1 Encounters:  07/05/23 5\' 10"  (1.778 m)    Weight:   Wt Readings from Last 1 Encounters:  07/05/23 83 kg    BMI:  Body mass index is 26.26 kg/m.  Estimated Nutritional Needs:   Kcal:  2000-2200  Protein:  125-140 grams  Fluid:  >2 L/day  Randine Butcher., RD, LDN, CNSC See AMiON for contact information

## 2023-07-07 DIAGNOSIS — Z7189 Other specified counseling: Secondary | ICD-10-CM

## 2023-07-07 DIAGNOSIS — I63232 Cerebral infarction due to unspecified occlusion or stenosis of left carotid arteries: Secondary | ICD-10-CM

## 2023-07-07 DIAGNOSIS — J150 Pneumonia due to Klebsiella pneumoniae: Secondary | ICD-10-CM | POA: Diagnosis not present

## 2023-07-07 DIAGNOSIS — Z515 Encounter for palliative care: Secondary | ICD-10-CM

## 2023-07-07 DIAGNOSIS — K5900 Constipation, unspecified: Secondary | ICD-10-CM | POA: Diagnosis not present

## 2023-07-07 DIAGNOSIS — I639 Cerebral infarction, unspecified: Secondary | ICD-10-CM | POA: Diagnosis not present

## 2023-07-07 LAB — GLUCOSE, CAPILLARY
Glucose-Capillary: 174 mg/dL — ABNORMAL HIGH (ref 70–99)
Glucose-Capillary: 174 mg/dL — ABNORMAL HIGH (ref 70–99)
Glucose-Capillary: 177 mg/dL — ABNORMAL HIGH (ref 70–99)
Glucose-Capillary: 219 mg/dL — ABNORMAL HIGH (ref 70–99)
Glucose-Capillary: 162 mg/dL — ABNORMAL HIGH (ref 70–99)
Glucose-Capillary: 160 mg/dL — ABNORMAL HIGH (ref 70–99)

## 2023-07-07 LAB — CBC
HCT: 38.3 % — ABNORMAL LOW (ref 39.0–52.0)
Hemoglobin: 12.1 g/dL — ABNORMAL LOW (ref 13.0–17.0)
MCH: 32.6 pg (ref 26.0–34.0)
MCHC: 31.6 g/dL (ref 30.0–36.0)
MCV: 103.2 fL — ABNORMAL HIGH (ref 80.0–100.0)
Platelets: 194 10*3/uL (ref 150–400)
RBC: 3.71 MIL/uL — ABNORMAL LOW (ref 4.22–5.81)
RDW: 12.9 % (ref 11.5–15.5)
WBC: 21.9 10*3/uL — ABNORMAL HIGH (ref 4.0–10.5)
nRBC: 0 % (ref 0.0–0.2)

## 2023-07-07 LAB — BASIC METABOLIC PANEL WITH GFR
Anion gap: 7 (ref 5–15)
BUN: 82 mg/dL — ABNORMAL HIGH (ref 8–23)
CO2: 27 mmol/L (ref 22–32)
Calcium: 7.9 mg/dL — ABNORMAL LOW (ref 8.9–10.3)
Chloride: 112 mmol/L — ABNORMAL HIGH (ref 98–111)
Creatinine, Ser: 1.42 mg/dL — ABNORMAL HIGH (ref 0.61–1.24)
GFR, Estimated: 51 mL/min — ABNORMAL LOW (ref >60)
Glucose, Bld: 184 mg/dL — ABNORMAL HIGH (ref 70–99)
Potassium: 4.6 mmol/L (ref 3.5–5.1)
Sodium: 146 mmol/L — ABNORMAL HIGH (ref 135–145)

## 2023-07-07 LAB — MAGNESIUM: Magnesium: 3 mg/dL — ABNORMAL HIGH (ref 1.7–2.4)

## 2023-07-07 LAB — TRIGLYCERIDES: Triglycerides: 57 mg/dL (ref <150)

## 2023-07-07 NOTE — Progress Notes (Incomplete)
 Palliative:  ***  Yong Channel, NP Palliative Medicine Team Pager [phone] (Please see [email or web address] for schedule) Team Phone [phone]

## 2023-07-07 NOTE — TOC Progression Note (Addendum)
 Transition of Care [facility]) - Progression Note    Patient Details  Name: [facility] MRN: [id number] Date of Birth: 18-Jan-1946  [facility]) CM/SW Contact  Icelynn Onken M, RN Phone Number: 07/07/2023, 1:22 PM  Clinical Narrative:    MD states patient medically stable for discharge to [facility] if bed available today.  [facility] has no bed today, but are anticipating possible patient admission over the weekend.  Facility admissions liaison will follow up with CCM providers over the weekend.    Addendum: 1:43pm [facility] admissions liaison states bed available in AM at [facility] facility.  Notified attending; admissions liaison will follow up with provider tomorrow regarding dc.    Expected Discharge Plan: Long Term Acute Care (LTAC) Barriers to Discharge: Barriers Resolved  Expected Discharge Plan and Services In-house Referral: Clinical Social Work   Post Acute Care Choice: Long Term Acute Care (LTAC) Living arrangements for the past 2 months: Apartment                                       Social Determinants of Health (SDOH) Interventions SDOH Screenings   Food Insecurity: Patient Unable To Answer (06/29/2023)  Housing: Patient Unable To Answer (06/29/2023)  Transportation Needs: Patient Unable To Answer (06/29/2023)  Utilities: Patient Unable To Answer (06/29/2023)  Social Connections: Unknown (06/29/2023)  Tobacco Use: High Risk (07/03/2023)    Readmission Risk Interventions     No data to display         Calla Catchings, RN, BSN  Trauma/Neuro ICU Case Manager [phone]

## 2023-07-07 NOTE — Progress Notes (Addendum)
 NAME:  Rickey Baker, MRN:  [id number], DOB:  1945-04-10, LOS: 10 ADMISSION DATE:  06/27/2023, CONSULTATION DATE:  06/27/23 REFERRING MD:  Alecia Ames CHIEF COMPLAINT:  Vent Management   History of Present Illness:  Pt is encephelopathic; therefore, this HPI is obtained from chart review. Rickey Baker is a 78 y.o. male who has a PMH as below clued but not limited to hypertension, hyperlipidemia, Parkinson disease.  He presented to [facility] ED on 06/27/2023 with right-sided weakness and aphasia.  He was in his normal state of health earlier in the evening when his wife helped him lie down for a nap around 5 PM.  When she woke up later, she found that he was slumped to the side and unable to talk.  She called 911 and he was brought to the ED as a code stroke.  CT revealed no hemorrhage, CTA confirmed a left ICA/MCA occlusion.  He did receive TNKase  at 2004.  He was then taken to neuro IR where he had a left M2 thrombectomy, L ICA stent placement with angioplasty performed.  Just prior to the procedure he apparently had a vomiting episode requiring suctioning therefore, following the procedure, he remained intubated and was transferred to the ICU PCCM was subsequently called in consultation for vent management. Per CRNA, he was awake during vomiting episode and had a strong cough during suctioning and there was NOT any concern for aspiration event.  Pertinent  Medical History:  has Acute hypoxic respiratory failure (HCC); COPD with acute exacerbation (HCC); Stroke (cerebrum) (HCC); Hypertension associated with diabetes (HCC); and Malnutrition of moderate degree on their problem list.  Significant Hospital Events: Including procedures, antibiotic start and stop dates in addition to other pertinent events   5/6 admit, to [facility] for left M2 thrombectomy, L ICA stent placement with angioplasty.  MRI s 5/7 CTH stable, increasing congestion, aspiration coverage started, diuresis 5/11 obtunded and reintubated, pressors  after intubation 5/12 family meeting #1 5/14 fevers, secretion.  Antibiotics broadened 5/15: change in neuro exam - stat head CT   Interim History / Subjective:   Has low-grade fevers.  Neuroexam is unchanged  Objective:  Blood pressure 134/76, pulse 91, temperature 100.1 F (37.8 C), temperature source Axillary, resp. rate (!) 26, height 5\' 10"  (1.778 m), weight 86.4 kg, SpO2 94%.    Vent Mode: PRVC FiO2 (%):  [50 %] 50 % Set Rate:  [26 bmp] 26 bmp Vt Set:  [580 mL] 580 mL PEEP:  [5 cmH20] 5 cmH20 Plateau Pressure:  [14 cmH20-17 cmH20] 14 cmH20   Intake/Output Summary (Last 24 hours) at 07/07/2023 0831 Last data filed at 07/07/2023 0600 Gross per 24 hour  Intake 1911.13 ml  Output 2075 ml  Net -163.87 ml   Filed Weights   07/04/23 0703 07/05/23 0500 07/07/23 0718  Weight: 81.4 kg 83 kg 86.4 kg   Examination: Gen:      No acute distress HEENT:  EOMI, sclera anicteric Neck:     No masses; no thyromegaly ET tube Lungs:    Clear to auscultation bilaterally; normal respiratory effort CV:         Regular rate and rhythm; no murmurs Abd:      + bowel sounds; soft, non-tender; no palpable masses, no distension Ext:    No edema; adequate peripheral perfusion Skin:      Warm and dry; no rash Neuro: Sedated, unresponsive  Lab/imaging reviewed Significant for sodium 146, BUN/creatinine 82/1.42 WBC 21.9, hemoglobin 12.1 No new imaging   Assessment &  Plan:   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  Residual R hemiplegia but exam was improving per notes.  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 Continue supportive neurologic care Repeat CT on 5/15 does not show any new changes On Sinmet for history of Parkinson's  COPD hx, GOLD D, frequent exacerbations Recurrent resp failure, Klebsiella PNA Worsening fevers, secretions on 5/14 Continue Vanco, Zosyn .  Follow cultures.  Follow fever curve Continue vent  support.  May be headed for tracheostomy  Constipation Bowel regimen  Diabetes On Semglee , SSI  Best practice (evaluated daily):  Diet/type: tubefeeds and NPO; EN per RD DVT prophylaxis:  LMWH  Pressure ulcer(s): per nursing documentation GI prophylaxis: PPI Lines: N/A Foley:  N/A Code Status:  full code Last date of multidisciplinary goals of care discussion: daughter updated   Critical care time:   Diona Peregoy MD [facility] See Amion for pager  If no response to pager , please call [phone] until 7pm After 7:00 pm call Elink  [phone] 07/07/2023, 8:36 AM

## 2023-07-07 NOTE — Progress Notes (Signed)
 Daily Progress Note   Patient Name: Rickey Baker       Date: 07/07/2023 DOB: January 18, 1946  Age: 78 y.o. MRN#: [id number] Attending Physician: Mannam, Praveen, MD Primary Care Physician: [facility], [facility] Admit Date: 06/27/2023  Reason for Consultation/Follow-up: Establishing goals of care   Length of Stay: 10  Current Medications: Scheduled Meds:    stroke: early stages of recovery book   Does not apply Once   arformoterol   15 mcg Nebulization BID   aspirin   81 mg Per Tube Daily   atorvastatin   80 mg Per Tube Daily   budesonide  (PULMICORT ) nebulizer solution  0.5 mg Nebulization BID   carbidopa -levodopa   1.5 tablet Per Tube TID   Chlorhexidine  Gluconate Cloth  6 each Topical Daily   enoxaparin  (LOVENOX ) injection  40 mg Subcutaneous Daily   feeding supplement (PROSource TF20)  60 mL Per Tube TID   free water  300 mL Per Tube Q4H   insulin  aspart  0-20 Units Subcutaneous Q4H   insulin  glargine-yfgn  7 Units Subcutaneous BID   metoCLOPramide (REGLAN) injection  5 mg Intravenous Q8H   multivitamin with minerals  1 tablet Per Tube Daily   mouth rinse  15 mL Mouth Rinse Q2H   pantoprazole  (PROTONIX ) IV  40 mg Intravenous QHS   polyethylene glycol  17 g Per Tube Daily   revefenacin   175 mcg Nebulization Daily   senna-docusate  1 tablet Per Tube BID   ticagrelor   90 mg Per Tube BID    Continuous Infusions:  feeding supplement (OSMOLITE 1.5 CAL) 50 mL/hr at 07/07/23 1200   fentaNYL  infusion INTRAVENOUS 50 mcg/hr (07/07/23 1200)   phenylephrine  (NEO-SYNEPHRINE) Adult infusion Stopped (07/06/23 1131)   piperacillin-tazobactam (ZOSYN)  IV 12.5 mL/hr at 07/07/23 1200   propofol  (DIPRIVAN ) infusion 10 mcg/kg/min (07/07/23 1200)   vancomycin 1,000 mg (07/07/23 1258)    PRN  Meds: acetaminophen  **OR** acetaminophen  (TYLENOL ) oral liquid 160 mg/5 mL **OR** acetaminophen , albuterol , fentaNYL  (SUBLIMAZE ) injection, hydrALAZINE , mouth rinse  Physical Exam Vitals reviewed.  Constitutional:      General: He is sleeping. He is not in acute distress.    Appearance: He is ill-appearing.     Interventions: He is intubated.  Cardiovascular:     Rate and Rhythm: Normal rate.  Pulmonary:     Effort: He is intubated.  Vital Signs: BP 101/76   Pulse 93   Temp 99.4 F (37.4 C) (Axillary)   Resp 15   Ht 5\' 10"  (1.778 m)   Wt 86.4 kg   SpO2 96%   BMI 27.33 kg/m  SpO2: SpO2: 96 % O2 Device: O2 Device: Ventilator O2 Flow Rate: O2 Flow Rate (L/min): 15 L/min  Intake/output summary:  Intake/Output Summary (Last 24 hours) at 07/07/2023 1259 Last data filed at 07/07/2023 1200 Gross per 24 hour  Intake 1984.55 ml  Output 1525 ml  Net 459.55 ml   LBM: Last BM Date : 07/07/23 Baseline Weight: Weight: 82.8 kg Most recent weight: Weight: 86.4 kg       Palliative Assessment/Data: 30% with feeds      Patient Active Problem List   Diagnosis Date Noted   Malnutrition of moderate degree 06/29/2023   Stroke (cerebrum) (HCC) 06/27/2023   Hypertension associated with diabetes (HCC) 06/27/2023   Acute hypoxic respiratory failure (HCC) 10/07/2022   COPD with acute exacerbation (HCC) 10/07/2022    Palliative Care Assessment & Plan   Patient Profile: 78 y.o. male with past medical history of Parkinson's disease, HTN, HLD admitted on 06/27/2023 with right sided weakness and aphasia found to have L ICA and L MCA occlusion. S/P L M2 thrombectomy and L ICA stent placement. 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 Intubated emergently 5/11.   Today's Discussion: Patient sleeping in NAD. Remains intubated. No family at bedside.  Received updates from nursing and Dr. Waylan Haggard. No significant  changes. Plan to discharge to [facility].   1:05 pm Called patient's spouse Engineer, structural. She visited her husband earlier today. She feels good about the plan to discharge to [facility] where they can continue ventilator management. She shared that the patient's sons will be in town later today. The family plans to meet to continue discussing goals of care. I encouraged the family to consider the patient's quality of life and what would be acceptable to him in the future when making decisions.  Encouraged family to call PMT with questions or concerns. PMT will continue to support.  Recommendations/Plan: Full code Full scope Family plans to meet over the weekend to further discuss goals of care Hopeful to discharge to [facility] Continued PMT support    Code Status:    Code Status Orders  (From admission, onward)           Start     Ordered   06/27/23 2021  Full code  Continuous       Question:  By:  Answer:  Consent: discussion documented in EHR   06/27/23 2022         Extensive chart review has been completed prior to seeing the patient including labs, vital signs, imaging, progress/consult notes, orders, medications, and available advance directive documents.  Care plan was discussed with bedside RN and Dr. Waylan Haggard  Time spent: 37 minutes  Thank you for allowing the Palliative Medicine Team to assist in the care of this patient.   Daina Drum, NP  Please contact Palliative Medicine Team phone at [phone] for questions and concerns.

## 2023-07-07 NOTE — Discharge Summary (Addendum)
 Physician Discharge Summary  Patient ID: Rickey Baker MRN: [id number] DOB/AGE: 11/16/1945 78 y.o.  Admit date: 06/27/2023 Discharge date: 07/07/2023  Problem List Principal Problem:   Stroke (cerebrum) [facility] Active Problems:   Acute hypoxic respiratory failure (HCC)   Hypertension associated with diabetes (HCC)   Malnutrition of moderate degree  HPI: 78 y.o. male who has a PMH as below clued but not limited to hypertension, hyperlipidemia, Parkinson disease.  He presented to [facility] ED on 06/27/2023 with right-sided weakness and aphasia.  He was in his normal state of health earlier in the evening when his wife helped him lie down for a nap around 5 PM.  When she woke up later, she found that he was slumped to the side and unable to talk.  She called 911 and he was brought to the ED as a code stroke.  CT revealed no hemorrhage, CTA confirmed a left ICA/MCA occlusion.  He did receive TNKase  at 2004.   He was then taken to neuro IR where he had a left M2 thrombectomy, L ICA stent placement with angioplasty performed.  Just prior to the procedure he apparently had a vomiting episode requiring suctioning therefore, following the procedure, he remained intubated and was transferred to the ICU PCCM was subsequently called in consultation for vent management. Per CRNA, he was awake during vomiting episode and had a strong cough during suctioning and there was NOT any concern for aspiration event.  Pt encephelopathic; therefore, this HPI is obtained from chart review.   Hospital Course:  5/6 admit, to [facility] for left M2 thrombectomy, L ICA stent placement with angioplasty.  MRI s 5/7 CTH stable, increasing congestion, aspiration coverage started, diuresis 5/11 obtunded and reintubated, pressors after intubation 5/12 family meeting #1 5/14 fevers, secretion.  Antibiotics broadened 5/15: change in neuro exam - stat head CT. WBC and fever curve going up - waiting on resp culture. Covering with antibiotics   5/16 Repeat CT on 5/15 does not show any new changes, following cultures- continue vanc/zosyn     General Physical Exam at Discharge:  Gen:      No acute distress, chronically ill-appearing HEENT:  EOMI, sclera anicteric Neck:     No masses; no thyromegaly, ET tube Lungs:    Clear to auscultation bilaterally; normal respiratory effort CV:         Regular rate and rhythm; no murmurs Abd:      + bowel sounds; soft, non-tender; no palpable masses, no distension Ext:    No edema; adequate peripheral perfusion Neuro: Awake, tracks with eyes.  Discharge Plan   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  Residual R hemiplegia but exam was improving per notes.  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 P: Continue supportive neurologic care Repeat CT on 5/15 does not show any new changes On Sinmet for history of Parkinson's   COPD hx, GOLD D, frequent exacerbations Recurrent resp failure, Klebsiella PNA Worsening fevers, secretions on 5/14 with new HAP P: High WBC count noted today.  Antibiotics broadened to Vanco, Zosyn .  Follow cultures.  Follow fever curve Continue vent support and weaning.  May be headed for tracheostomy   Constipation P: Bowel regimen   Diabetes P:  On Semglee , SSI   Labs at discharge Lab Results  Component Value Date   CREATININE 1.42 (H) 07/07/2023   BUN 82 (H) 07/07/2023   NA 146 (H) 07/07/2023   K 4.6  07/07/2023   CL 112 (H) 07/07/2023   CO2 27 07/07/2023   Lab Results  Component Value Date   WBC 21.9 (H) 07/07/2023   HGB 12.1 (L) 07/07/2023   HCT 38.3 (L) 07/07/2023   MCV 103.2 (H) 07/07/2023   PLT 194 07/07/2023   Lab Results  Component Value Date   ALT 15 06/27/2023   AST 19 06/27/2023   ALKPHOS 61 06/27/2023   BILITOT 0.5 06/27/2023   Lab Results  Component Value Date   INR 1.0 06/27/2023    Current radiology studies CT HEAD WO CONTRAST ( ) Result Date:  07/06/2023 CLINICAL DATA:  Neuro deficit, concern for stroke. EXAM: CT HEAD WITHOUT CONTRAST TECHNIQUE: Contiguous axial images were obtained from the base of the skull through the vertex without intravenous contrast. RADIATION DOSE REDUCTION: This exam was performed according to the departmental dose-optimization program which includes automated exposure control, adjustment of the mA and/or kV according to patient size and/or use of iterative reconstruction technique. COMPARISON:  CT head 07/03/2023 and MRI head 06/28/2023. FINDINGS: Brain: Redemonstrated hypoattenuation in the left basal ganglia involving the caudate head, anterior limb of the internal capsule and the lentiform nuclei. Additional hypoattenuation involving the left insular cortex and subinsular region. Additional scattered infarcts in the left MCA territory are similar to prior. No evidence of hemorrhagic transformation. Remote infarct involving the right basal ganglia. Nonspecific hypoattenuation in the periventricular and subcortical white matter favored to reflect chronic microvascular ischemic changes. Stable size and configuration of the ventricles. No midline shift. The basilar cisterns are patent. Vascular: No hyperdense vessel or unexpected calcification. Skull: Normal. Negative for fracture or focal lesion. Sinuses/Orbits: Orbits are symmetric. Mucosal thickening throughout the paranasal sinuses with layering fluid in the right sphenoid sinus, slightly increased from prior. Other: Mastoid air cells are clear. Partially visualized nasoenteric tube. IMPRESSION: Redemonstrated infarcts in the left MCA territory which are similar to prior CT. No evidence of hemorrhagic conversion. Paranasal sinus disease with increased air-fluid level in the right sphenoid sinus. Electronically Signed   By: Denny Flack M.D.   On: 07/06/2023 17:06    Disposition: [facility]      Allergies as of 07/08/2023       Reactions   Indomethacin Nausea  And Vomiting, Other (See Comments)   Other reaction(s): Blurred vision Ear ringing        Medication List     TAKE these medications    albuterol  108 (90 Base) MCG/ACT inhaler Commonly known as: VENTOLIN  HFA INHALE 2 PUFFS BY MOUTH FOUR TIMES A DAY AS NEEDED FOR WHEEZING, SHORTNESS OF BREATHE, AND COUGH   albuterol  (2.5 MG/3ML) 0.083% nebulizer solution Commonly known as: PROVENTIL  Take 3 mLs (2.5 mg total) by nebulization every 6 (six) hours as needed for wheezing or shortness of breath.   ascorbic acid 500 MG tablet Commonly known as: VITAMIN C Take 500 mg by mouth daily.   atorvastatin  40 MG tablet Commonly known as: LIPITOR  Take 40 mg by mouth at bedtime.   Breztri  Aerosphere 160-9-4.8 MCG/ACT Aero inhaler Generic drug: budesonide -glycopyrrolate-formoterol Inhale 2 puffs into the lungs in the morning and at bedtime.   celecoxib 100 MG capsule Commonly known as: CELEBREX Take 100 mg by mouth 2 (two) times daily as needed (joint pain).   celecoxib 200 MG capsule Commonly known as: CELEBREX Take 200 mg by mouth 2 (two) times daily with a meal.   cetirizine 10 MG tablet Commonly known as: ZYRTEC Take 10 mg by mouth daily.  empagliflozin 25 MG Tabs tablet Commonly known as: JARDIANCE Take 12.5 mg by mouth daily.   ezetimibe 10 MG tablet Commonly known as: ZETIA TAKE ONE-HALF TABLET BY MOUTH DAILY FOR CHOLESTEROL CONTROL   gabapentin 400 MG tablet Commonly known as: NEURONTIN Take 400 mg by mouth 3 (three) times daily.   guaifenesin 400 MG Tabs tablet Commonly known as: HUMIBID E Take 400 mg by mouth 2 (two) times daily as needed (congestion).   Ipratropium-Albuterol  20-100 MCG/ACT Aers respimat Commonly known as: COMBIVENT Inhale 1 puff into the lungs 4 (four) times daily as needed (breathing).   losartan  50 MG tablet Commonly known as: COZAAR  Take 25 mg by mouth daily.   pantoprazole  40 MG tablet Commonly known as: PROTONIX  Take 40 mg by mouth  daily.   pyridOXINE 25 MG tablet Commonly known as: VITAMIN B6 Take 25 mg by mouth daily.   sildenafil 100 MG tablet Commonly known as: VIAGRA Take 100 mg by mouth as needed for erectile dysfunction.   terbinafine 1 % cream Commonly known as: LAMISIL Apply 1 Application topically 2 (two) times daily.   Vitamin D (Cholecalciferol) 25 MCG (1000 UT) Tabs Take 1 tablet by mouth daily.        Follow-up Information     [facility]. Schedule an appointment as soon as possible for a visit in 1 month(s).   Specialty: Neurology Why: stroke clinic Contact information: [address] [place]  [address] [phone]                 Scheduled Meds:   stroke: early stages of recovery book  Does not apply Once   arformoterol   15 mcg Nebulization BID   aspirin   81 mg Per Tube Daily   atorvastatin   80 mg Per Tube Daily   budesonide  (PULMICORT ) nebulizer solution  0.5 mg Nebulization BID   carbidopa -levodopa   1.5 tablet Per Tube TID   Chlorhexidine  Gluconate Cloth  6 each Topical Daily   enoxaparin  (LOVENOX ) injection  40 mg Subcutaneous Daily   feeding supplement (PROSource TF20)  60 mL Per Tube TID   free water   300 mL Per Tube Q4H   insulin  aspart  0-20 Units Subcutaneous Q4H   insulin  glargine-yfgn  7 Units Subcutaneous BID   multivitamin with minerals  1 tablet Per Tube Daily   mouth rinse  15 mL Mouth Rinse Q2H   pantoprazole  (PROTONIX ) IV  40 mg Intravenous QHS   polyethylene glycol  17 g Per Tube Daily   revefenacin   175 mcg Nebulization Daily   senna-docusate  1 tablet Per Tube BID   ticagrelor   90 mg Per Tube BID        Continuous Infusions:  feeding supplement (OSMOLITE 1.5 CAL) 50 mL/hr at 07/08/23 0800   fentaNYL  infusion INTRAVENOUS 50 mcg/hr (07/08/23 0800)   phenylephrine  (NEO-SYNEPHRINE) Adult infusion Stopped (07/06/23 1131)   piperacillin -tazobactam (ZOSYN )  IV 12.5 mL/hr at 07/08/23 0800    propofol  (DIPRIVAN ) infusion Stopped (07/08/23 0744)   vancomycin  Stopped (07/07/23 1358)        PRN Meds:. acetaminophen  **OR** acetaminophen  (TYLENOL ) oral liquid 160 mg/5 mL **OR** acetaminophen , albuterol , fentaNYL  (SUBLIMAZE ) injection, hydrALAZINE , mouth rinse    Discharged Condition: stable  Time spent on discharge greater than 40 minutes.  Vital signs at Discharge. Temp:  [99.4 F (37.4 C)-100.1 F (37.8 C)] 99.4 F (37.4 C) (05/16 1202) Pulse Rate:  [70-106] 93 (05/16 1215) Resp:  [15-30] 15 (05/16 1215) BP: (97-141)/(62-96)  101/76 (05/16 1215) SpO2:  [89 %-99 %] 96 % (05/16 1215) FiO2 (%):  [50 %] 50 % (05/16 1215) Weight:  [86.4 kg] 86.4 kg (05/16 0718) Office follow up Special Information or instructions.   Signed: Markevion Lattin MD [place] See Amion for pager  If no response to pager , please call [phone] until 7pm After 7:00 pm call Elink  [phone] 07/08/2023, 8:43 AM

## 2023-07-08 ENCOUNTER — Inpatient Hospital Stay
Admission: RE | Admit: 2023-07-08 | Discharge: 2023-08-22 | Disposition: E | Payer: Self-pay | Source: Other Acute Inpatient Hospital | Attending: Internal Medicine | Admitting: Internal Medicine

## 2023-07-08 ENCOUNTER — Other Ambulatory Visit (HOSPITAL_COMMUNITY): Payer: Self-pay

## 2023-07-08 ENCOUNTER — Inpatient Hospital Stay (HOSPITAL_COMMUNITY)

## 2023-07-08 DIAGNOSIS — I63232 Cerebral infarction due to unspecified occlusion or stenosis of left carotid arteries: Secondary | ICD-10-CM | POA: Diagnosis not present

## 2023-07-08 DIAGNOSIS — K5900 Constipation, unspecified: Secondary | ICD-10-CM | POA: Diagnosis not present

## 2023-07-08 DIAGNOSIS — J150 Pneumonia due to Klebsiella pneumoniae: Secondary | ICD-10-CM | POA: Diagnosis not present

## 2023-07-08 LAB — BLOOD GAS, ARTERIAL
Acid-Base Excess: 4.4 mmol/L — ABNORMAL HIGH (ref 0.0–2.0)
Acid-Base Excess: 5.6 mmol/L — ABNORMAL HIGH (ref 0.0–2.0)
Bicarbonate: 33 mmol/L — ABNORMAL HIGH (ref 20.0–28.0)
Bicarbonate: 33.1 mmol/L — ABNORMAL HIGH (ref 20.0–28.0)
O2 Saturation: 86 %
O2 Saturation: 99.2 %
Patient temperature: 37
Patient temperature: 37
pCO2 arterial: 67 mmHg (ref 32–48)
pCO2 arterial: 60 mmHg — ABNORMAL HIGH (ref 32–48)
pH, Arterial: 7.3 — ABNORMAL LOW (ref 7.35–7.45)
pH, Arterial: 7.35 (ref 7.35–7.45)
pO2, Arterial: 54 mmHg — ABNORMAL LOW (ref 83–108)
pO2, Arterial: 81 mmHg — ABNORMAL LOW (ref 83–108)

## 2023-07-08 LAB — BASIC METABOLIC PANEL WITH GFR
Anion gap: 9 (ref 5–15)
BUN: 88 mg/dL — ABNORMAL HIGH (ref 8–23)
CO2: 26 mmol/L (ref 22–32)
Calcium: 8 mg/dL — ABNORMAL LOW (ref 8.9–10.3)
Chloride: 113 mmol/L — ABNORMAL HIGH (ref 98–111)
Creatinine, Ser: 1.43 mg/dL — ABNORMAL HIGH (ref 0.61–1.24)
GFR, Estimated: 50 mL/min — ABNORMAL LOW (ref >60)
Glucose, Bld: 100 mg/dL — ABNORMAL HIGH (ref 70–99)
Potassium: 4.8 mmol/L (ref 3.5–5.1)
Sodium: 148 mmol/L — ABNORMAL HIGH (ref 135–145)

## 2023-07-08 LAB — CBC
HCT: 37 % — ABNORMAL LOW (ref 39.0–52.0)
Hemoglobin: 12 g/dL — ABNORMAL LOW (ref 13.0–17.0)
MCH: 33.2 pg (ref 26.0–34.0)
MCHC: 32.4 g/dL (ref 30.0–36.0)
MCV: 102.5 fL — ABNORMAL HIGH (ref 80.0–100.0)
Platelets: 191 10*3/uL (ref 150–400)
RBC: 3.61 MIL/uL — ABNORMAL LOW (ref 4.22–5.81)
RDW: 12.9 % (ref 11.5–15.5)
WBC: 25.6 10*3/uL — ABNORMAL HIGH (ref 4.0–10.5)
nRBC: 0 % (ref 0.0–0.2)

## 2023-07-08 LAB — GLUCOSE, CAPILLARY
Glucose-Capillary: 105 mg/dL — ABNORMAL HIGH (ref 70–99)
Glucose-Capillary: 188 mg/dL — ABNORMAL HIGH (ref 70–99)

## 2023-07-08 LAB — C DIFFICILE QUICK SCREEN W PCR REFLEX
C Diff antigen: NEGATIVE
C Diff interpretation: NOT DETECTED
C Diff toxin: NEGATIVE

## 2023-07-08 LAB — MAGNESIUM: Magnesium: 3.1 mg/dL — ABNORMAL HIGH (ref 1.7–2.4)

## 2023-07-08 NOTE — Plan of Care (Deleted)
  Problem: Education: Goal: Knowledge of disease or condition will improve Outcome: Not Progressing   Problem: Ischemic Stroke/TIA Tissue Perfusion: Goal: Complications of ischemic stroke/TIA will be minimized Outcome: Not Progressing   Problem: Self-Care: Goal: Ability to communicate needs accurately will improve Outcome: Not Progressing   Problem: Nutrition: Goal: Dietary intake will improve Outcome: Progressing   Problem: Clinical Measurements: Goal: Respiratory complications will improve Outcome: Not Progressing Goal: Cardiovascular complication will be avoided Outcome: Progressing   Problem: Elimination: Goal: Will not experience complications related to urinary retention Outcome: Progressing

## 2023-07-08 NOTE — Progress Notes (Signed)
 Report given to [facility] RN.  Patient transfer with RT.  Spouse notified.  Propofol  and Fentanyl  infusion transfered with patient.

## 2023-07-08 NOTE — Progress Notes (Addendum)
 NAME:  Rickey Baker, MRN:  [id number], DOB:  1945/04/04, LOS: 11 ADMISSION DATE:  06/27/2023, CONSULTATION DATE:  06/27/23 REFERRING MD:  Alecia Ames CHIEF COMPLAINT:  Vent Management   History of Present Illness:  Pt is encephelopathic; therefore, this HPI is obtained from chart review. Zalan Shidler is a 78 y.o. male who has a PMH as below clued but not limited to hypertension, hyperlipidemia, Parkinson disease.  He presented to [facility] ED on 06/27/2023 with right-sided weakness and aphasia.  He was in his normal state of health earlier in the evening when his wife helped him lie down for a nap around 5 PM.  When she woke up later, she found that he was slumped to the side and unable to talk.  She called 911 and he was brought to the ED as a code stroke.  CT revealed no hemorrhage, CTA confirmed a left ICA/MCA occlusion.  He did receive TNKase  at 2004.  He was then taken to neuro IR where he had a left M2 thrombectomy, L ICA stent placement with angioplasty performed.  Just prior to the procedure he apparently had a vomiting episode requiring suctioning therefore, following the procedure, he remained intubated and was transferred to the ICU PCCM was subsequently called in consultation for vent management. Per CRNA, he was awake during vomiting episode and had a strong cough during suctioning and there was NOT any concern for aspiration event.  Pertinent  Medical History:  has Acute hypoxic respiratory failure (HCC); COPD with acute exacerbation (HCC); Stroke (cerebrum) (HCC); Hypertension associated with diabetes (HCC); and Malnutrition of moderate degree on their problem list.  Significant Hospital Events: Including procedures, antibiotic start and stop dates in addition to other pertinent events   5/6 admit, to [facility] for left M2 thrombectomy, L ICA stent placement with angioplasty.  MRI s 5/7 CTH stable, increasing congestion, aspiration coverage started, diuresis 5/11 obtunded and reintubated, pressors  after intubation 5/12 family meeting #1 5/14 fevers, secretion.  Antibiotics broadened 5/15: change in neuro exam - stat head CT which did not show any new changes.  Interim History / Subjective:   Remains on the ventilator.  Low-grade fevers overnight  Objective:  Blood pressure 118/67, pulse 73, temperature 99 F (37.2 C), temperature source Axillary, resp. rate (!) 26, height 5\' 10"  (1.778 m), weight 84.4 kg, SpO2 97%.    Vent Mode: PRVC FiO2 (%):  [50 %] 50 % Set Rate:  [26 bmp] 26 bmp Vt Set:  [580 mL] 580 mL PEEP:  [5 cmH20] 5 cmH20 Plateau Pressure:  [15 cmH20-19 cmH20] 15 cmH20   Intake/Output Summary (Last 24 hours) at 07/08/2023 0749 Last data filed at 07/08/2023 0700 Gross per 24 hour  Intake 6669.15 ml  Output 2200 ml  Net 4469.15 ml   Filed Weights   07/05/23 0500 07/07/23 0718 07/08/23 0500  Weight: 83 kg 86.4 kg 84.4 kg   Examination: Gen:      No acute distress HEENT:  EOMI, sclera anicteric, ET tube Neck:     No masses; no thyromegaly Lungs:    Clear to auscultation bilaterally; normal respiratory effort CV:         Regular rate and rhythm; no murmurs Abd:      + bowel sounds; soft, non-tender; no palpable masses, no distension Ext:    No edema; adequate peripheral perfusion Neuro: Sedated, unresponsive  Lab/imaging reviewed Sodium 148, BUN/creatinine 88/1.43 WBC 25.6, hemoglobin 12.0 Chest x-ray with left greater than right bibasilar infiltrates  Assessment & Plan:  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  Residual R hemiplegia but exam was improving per notes.  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 Continue supportive neurologic care Repeat CT on 5/15 does not show any new changes On Sinmet for history of Parkinson's  COPD hx, GOLD D, frequent exacerbations Recurrent resp failure, Klebsiella PNA Worsening fevers, secretions on 5/14, likely has new HAP Continue Vanco,  Zosyn .  Follow cultures.  Add tracheal aspirate cultures. Follow fever curve WBC count higher today.  Will continue to monitor Continue vent support.  May be headed for tracheostomy  Constipation Bowel regimen  Diabetes On [place] , SSI  Disposition Awaiting placement at select  Best practice (evaluated daily):  Diet/type: tubefeeds and NPO; EN per RD DVT prophylaxis:  LMWH  Pressure ulcer(s): per nursing documentation GI prophylaxis: PPI Lines: N/A Foley:  N/A Code Status:  full code Last date of multidisciplinary goals of care discussion: daughter updated   Critical care time:   Phyllis Breeze MD  Pulmonary & Critical care See Amion for pager  If no response to pager , please call [phone] until 7pm After 7:00 pm call Elink  [phone] 07/08/2023, 7:49 AM   Scheduled Meds:   stroke: early stages of recovery book   Does not apply Once   arformoterol   15 mcg Nebulization BID   aspirin   81 mg Per Tube Daily   atorvastatin   80 mg Per Tube Daily   budesonide  (PULMICORT ) nebulizer solution  0.5 mg Nebulization BID   carbidopa -levodopa   1.5 tablet Per Tube TID   Chlorhexidine  Gluconate Cloth  6 each Topical Daily   enoxaparin  (LOVENOX ) injection  40 mg Subcutaneous Daily   feeding supplement (PROSource TF20)  60 mL Per Tube TID   free water   300 mL Per Tube Q4H   insulin  aspart  0-20 Units Subcutaneous Q4H   insulin  glargine-yfgn  7 Units Subcutaneous BID   multivitamin with minerals  1 tablet Per Tube Daily   mouth rinse  15 mL Mouth Rinse Q2H   pantoprazole  (PROTONIX ) IV  40 mg Intravenous QHS   polyethylene glycol  17 g Per Tube Daily   revefenacin   175 mcg Nebulization Daily   senna-docusate  1 tablet Per Tube BID   ticagrelor   90 mg Per Tube BID   Continuous Infusions:  feeding supplement (OSMOLITE 1.5 CAL) 50 mL/hr at 07/08/23 0800   fentaNYL  infusion INTRAVENOUS 50 mcg/hr (07/08/23 0800)   phenylephrine  (NEO-SYNEPHRINE) Adult infusion Stopped  (07/06/23 1131)   piperacillin -tazobactam (ZOSYN )  IV 12.5 mL/hr at 07/08/23 0800   propofol  (DIPRIVAN ) infusion Stopped (07/08/23 0744)   vancomycin  Stopped (07/07/23 1358)   PRN Meds:.acetaminophen  **OR** acetaminophen  (TYLENOL ) oral liquid 160 mg/5 mL **OR** acetaminophen , albuterol , fentaNYL  (SUBLIMAZE ) injection, hydrALAZINE , mouth rinse

## 2023-07-08 NOTE — Plan of Care (Signed)
  Problem: Education: Goal: Knowledge of disease or condition will improve Outcome: Not Progressing   Problem: Ischemic Stroke/TIA Tissue Perfusion: Goal: Complications of ischemic stroke/TIA will be minimized Outcome: Not Progressing   Problem: Self-Care: Goal: Ability to communicate needs accurately will improve Outcome: Not Progressing   Problem: Nutrition: Goal: Dietary intake will improve Outcome: Progressing   Problem: Clinical Measurements: Goal: Respiratory complications will improve Outcome: Not Progressing Goal: Cardiovascular complication will be avoided Outcome: Progressing   Problem: Clinical Measurements: Goal: Cardiovascular complication will be avoided Outcome: Progressing   Problem: Elimination: Goal: Will not experience complications related to urinary retention Outcome: Progressing   Problem: Respiratory: Goal: Ability to maintain a clear airway and adequate ventilation will improve Outcome: Not Progressing   Problem: Role Relationship: Goal: Method of communication will improve Outcome: Not Progressing

## 2023-07-09 LAB — CBC
HCT: 32.6 % — ABNORMAL LOW (ref 39.0–52.0)
Hemoglobin: 10.4 g/dL — ABNORMAL LOW (ref 13.0–17.0)
MCH: 32.3 pg (ref 26.0–34.0)
MCHC: 31.9 g/dL (ref 30.0–36.0)
MCV: 101.2 fL — ABNORMAL HIGH (ref 80.0–100.0)
Platelets: 212 10*3/uL (ref 150–400)
RBC: 3.22 MIL/uL — ABNORMAL LOW (ref 4.22–5.81)
RDW: 12.9 % (ref 11.5–15.5)
WBC: 22.7 10*3/uL — ABNORMAL HIGH (ref 4.0–10.5)
nRBC: 0 % (ref 0.0–0.2)

## 2023-07-09 LAB — COMPREHENSIVE METABOLIC PANEL WITH GFR
ALT: 20 U/L (ref 0–44)
AST: 69 U/L — ABNORMAL HIGH (ref 15–41)
Albumin: 2 g/dL — ABNORMAL LOW (ref 3.5–5.0)
Alkaline Phosphatase: 60 U/L (ref 38–126)
Anion gap: 7 (ref 5–15)
BUN: 68 mg/dL — ABNORMAL HIGH (ref 8–23)
CO2: 29 mmol/L (ref 22–32)
Calcium: 7.9 mg/dL — ABNORMAL LOW (ref 8.9–10.3)
Chloride: 112 mmol/L — ABNORMAL HIGH (ref 98–111)
Creatinine, Ser: 1.14 mg/dL (ref 0.61–1.24)
GFR, Estimated: >60 mL/min (ref >60)
Glucose, Bld: 212 mg/dL — ABNORMAL HIGH (ref 70–99)
Potassium: 4.6 mmol/L (ref 3.5–5.1)
Sodium: 148 mmol/L — ABNORMAL HIGH (ref 135–145)
Total Bilirubin: 0.8 mg/dL (ref 0.0–1.2)
Total Protein: 5.4 g/dL — ABNORMAL LOW (ref 6.5–8.1)

## 2023-07-09 LAB — MAGNESIUM: Magnesium: 2.8 mg/dL — ABNORMAL HIGH (ref 1.7–2.4)

## 2023-07-09 LAB — VANCOMYCIN, TROUGH: Vancomycin Tr: 9 ug/mL — ABNORMAL LOW (ref 15–20)

## 2023-07-10 ENCOUNTER — Encounter (HOSPITAL_COMMUNITY): Payer: Self-pay

## 2023-07-10 DIAGNOSIS — I63319 Cerebral infarction due to thrombosis of unspecified middle cerebral artery: Secondary | ICD-10-CM

## 2023-07-10 DIAGNOSIS — J9621 Acute and chronic respiratory failure with hypoxia: Secondary | ICD-10-CM

## 2023-07-10 DIAGNOSIS — Z930 Tracheostomy status: Secondary | ICD-10-CM

## 2023-07-10 DIAGNOSIS — G20B2 Parkinson's disease with dyskinesia, with fluctuations: Secondary | ICD-10-CM

## 2023-07-10 LAB — VANCOMYCIN, TROUGH: Vancomycin Tr: 12 ug/mL — ABNORMAL LOW (ref 15–20)

## 2023-07-11 ENCOUNTER — Encounter (HOSPITAL_COMMUNITY)

## 2023-07-11 ENCOUNTER — Ambulatory Visit (HOSPITAL_BASED_OUTPATIENT_CLINIC_OR_DEPARTMENT_OTHER): Payer: Self-pay

## 2023-07-11 DIAGNOSIS — G20B2 Parkinson's disease with dyskinesia, with fluctuations: Secondary | ICD-10-CM

## 2023-07-11 DIAGNOSIS — M7989 Other specified soft tissue disorders: Secondary | ICD-10-CM | POA: Diagnosis not present

## 2023-07-11 DIAGNOSIS — I63319 Cerebral infarction due to thrombosis of unspecified middle cerebral artery: Secondary | ICD-10-CM

## 2023-07-11 DIAGNOSIS — J9621 Acute and chronic respiratory failure with hypoxia: Secondary | ICD-10-CM

## 2023-07-11 DIAGNOSIS — Z930 Tracheostomy status: Secondary | ICD-10-CM

## 2023-07-11 LAB — CULTURE, BLOOD (ROUTINE X 2)
Culture: NO GROWTH
Culture: NO GROWTH
Special Requests: ADEQUATE

## 2023-07-11 NOTE — Progress Notes (Signed)
 Left lower extremity venous  has been completed. Refer to [facility] under chart review to view preliminary results.   07/11/2023  10:51 AM Amulya Quintin, Hollace Lund

## 2023-07-12 ENCOUNTER — Other Ambulatory Visit (HOSPITAL_COMMUNITY): Payer: Self-pay

## 2023-07-12 DIAGNOSIS — J9621 Acute and chronic respiratory failure with hypoxia: Secondary | ICD-10-CM

## 2023-07-12 DIAGNOSIS — G20B2 Parkinson's disease with dyskinesia, with fluctuations: Secondary | ICD-10-CM

## 2023-07-12 DIAGNOSIS — I63319 Cerebral infarction due to thrombosis of unspecified middle cerebral artery: Secondary | ICD-10-CM

## 2023-07-12 DIAGNOSIS — Z930 Tracheostomy status: Secondary | ICD-10-CM

## 2023-07-12 LAB — COMPREHENSIVE METABOLIC PANEL WITH GFR
ALT: 52 U/L — ABNORMAL HIGH (ref 0–44)
AST: 86 U/L — ABNORMAL HIGH (ref 15–41)
Albumin: 1.7 g/dL — ABNORMAL LOW (ref 3.5–5.0)
Alkaline Phosphatase: 64 U/L (ref 38–126)
Anion gap: 7 (ref 5–15)
BUN: 44 mg/dL — ABNORMAL HIGH (ref 8–23)
CO2: 31 mmol/L (ref 22–32)
Calcium: 8 mg/dL — ABNORMAL LOW (ref 8.9–10.3)
Chloride: 109 mmol/L (ref 98–111)
Creatinine, Ser: 0.84 mg/dL (ref 0.61–1.24)
GFR, Estimated: >60 mL/min (ref >60)
Glucose, Bld: 147 mg/dL — ABNORMAL HIGH (ref 70–99)
Potassium: 4 mmol/L (ref 3.5–5.1)
Sodium: 147 mmol/L — ABNORMAL HIGH (ref 135–145)
Total Bilirubin: 0.6 mg/dL (ref 0.0–1.2)
Total Protein: 5.5 g/dL — ABNORMAL LOW (ref 6.5–8.1)

## 2023-07-12 LAB — CBC
HCT: 30.7 % — ABNORMAL LOW (ref 39.0–52.0)
Hemoglobin: 9.8 g/dL — ABNORMAL LOW (ref 13.0–17.0)
MCH: 32.8 pg (ref 26.0–34.0)
MCHC: 31.9 g/dL (ref 30.0–36.0)
MCV: 102.7 fL — ABNORMAL HIGH (ref 80.0–100.0)
Platelets: 250 10*3/uL (ref 150–400)
RBC: 2.99 MIL/uL — ABNORMAL LOW (ref 4.22–5.81)
RDW: 13 % (ref 11.5–15.5)
WBC: 18.5 10*3/uL — ABNORMAL HIGH (ref 4.0–10.5)
nRBC: 0 % (ref 0.0–0.2)

## 2023-07-12 LAB — VANCOMYCIN, TROUGH: Vancomycin Tr: 11 ug/mL — ABNORMAL LOW (ref 15–20)

## 2023-07-12 LAB — MAGNESIUM: Magnesium: 2.2 mg/dL (ref 1.7–2.4)

## 2023-07-13 DIAGNOSIS — I63319 Cerebral infarction due to thrombosis of unspecified middle cerebral artery: Secondary | ICD-10-CM

## 2023-07-13 DIAGNOSIS — J9621 Acute and chronic respiratory failure with hypoxia: Secondary | ICD-10-CM

## 2023-07-13 DIAGNOSIS — G20B2 Parkinson's disease with dyskinesia, with fluctuations: Secondary | ICD-10-CM

## 2023-07-13 DIAGNOSIS — Z930 Tracheostomy status: Secondary | ICD-10-CM

## 2023-07-14 DIAGNOSIS — Z930 Tracheostomy status: Secondary | ICD-10-CM

## 2023-07-14 DIAGNOSIS — I63319 Cerebral infarction due to thrombosis of unspecified middle cerebral artery: Secondary | ICD-10-CM

## 2023-07-14 DIAGNOSIS — G20B2 Parkinson's disease with dyskinesia, with fluctuations: Secondary | ICD-10-CM

## 2023-07-14 DIAGNOSIS — J9621 Acute and chronic respiratory failure with hypoxia: Secondary | ICD-10-CM

## 2023-07-15 DIAGNOSIS — G20B2 Parkinson's disease with dyskinesia, with fluctuations: Secondary | ICD-10-CM

## 2023-07-15 DIAGNOSIS — I63319 Cerebral infarction due to thrombosis of unspecified middle cerebral artery: Secondary | ICD-10-CM

## 2023-07-15 DIAGNOSIS — J9621 Acute and chronic respiratory failure with hypoxia: Secondary | ICD-10-CM

## 2023-07-15 DIAGNOSIS — Z930 Tracheostomy status: Secondary | ICD-10-CM

## 2023-07-16 ENCOUNTER — Institutional Professional Consult (permissible substitution) (HOSPITAL_COMMUNITY): Payer: Self-pay

## 2023-07-16 LAB — COMPREHENSIVE METABOLIC PANEL WITH GFR
ALT: 48 U/L — ABNORMAL HIGH (ref 0–44)
AST: 79 U/L — ABNORMAL HIGH (ref 15–41)
Albumin: 1.8 g/dL — ABNORMAL LOW (ref 3.5–5.0)
Alkaline Phosphatase: 86 U/L (ref 38–126)
Anion gap: 8 (ref 5–15)
BUN: 29 mg/dL — ABNORMAL HIGH (ref 8–23)
CO2: 30 mmol/L (ref 22–32)
Calcium: 8.2 mg/dL — ABNORMAL LOW (ref 8.9–10.3)
Chloride: 100 mmol/L (ref 98–111)
Creatinine, Ser: 0.57 mg/dL — ABNORMAL LOW (ref 0.61–1.24)
GFR, Estimated: >60 mL/min (ref >60)
Glucose, Bld: 138 mg/dL — ABNORMAL HIGH (ref 70–99)
Potassium: 4.5 mmol/L (ref 3.5–5.1)
Sodium: 138 mmol/L (ref 135–145)
Total Bilirubin: 0.7 mg/dL (ref 0.0–1.2)
Total Protein: 6.1 g/dL — ABNORMAL LOW (ref 6.5–8.1)

## 2023-07-16 LAB — CBC
HCT: 32 % — ABNORMAL LOW (ref 39.0–52.0)
Hemoglobin: 10.3 g/dL — ABNORMAL LOW (ref 13.0–17.0)
MCH: 32.6 pg (ref 26.0–34.0)
MCHC: 32.2 g/dL (ref 30.0–36.0)
MCV: 101.3 fL — ABNORMAL HIGH (ref 80.0–100.0)
Platelets: 303 10*3/uL (ref 150–400)
RBC: 3.16 MIL/uL — ABNORMAL LOW (ref 4.22–5.81)
RDW: 12.3 % (ref 11.5–15.5)
WBC: 18.9 10*3/uL — ABNORMAL HIGH (ref 4.0–10.5)
nRBC: 0 % (ref 0.0–0.2)

## 2023-07-17 DIAGNOSIS — Z930 Tracheostomy status: Secondary | ICD-10-CM

## 2023-07-17 DIAGNOSIS — I63319 Cerebral infarction due to thrombosis of unspecified middle cerebral artery: Secondary | ICD-10-CM

## 2023-07-17 DIAGNOSIS — J9621 Acute and chronic respiratory failure with hypoxia: Secondary | ICD-10-CM

## 2023-07-17 DIAGNOSIS — G20B2 Parkinson's disease with dyskinesia, with fluctuations: Secondary | ICD-10-CM

## 2023-07-18 ENCOUNTER — Institutional Professional Consult (permissible substitution) (HOSPITAL_COMMUNITY): Payer: Self-pay

## 2023-07-18 DIAGNOSIS — G20B2 Parkinson's disease with dyskinesia, with fluctuations: Secondary | ICD-10-CM

## 2023-07-18 DIAGNOSIS — J9621 Acute and chronic respiratory failure with hypoxia: Secondary | ICD-10-CM

## 2023-07-18 DIAGNOSIS — I63319 Cerebral infarction due to thrombosis of unspecified middle cerebral artery: Secondary | ICD-10-CM

## 2023-07-18 DIAGNOSIS — Z930 Tracheostomy status: Secondary | ICD-10-CM

## 2023-07-19 ENCOUNTER — Institutional Professional Consult (permissible substitution) (HOSPITAL_COMMUNITY): Payer: Self-pay

## 2023-07-19 DIAGNOSIS — I63319 Cerebral infarction due to thrombosis of unspecified middle cerebral artery: Secondary | ICD-10-CM

## 2023-07-19 DIAGNOSIS — J9621 Acute and chronic respiratory failure with hypoxia: Secondary | ICD-10-CM

## 2023-07-19 DIAGNOSIS — Z930 Tracheostomy status: Secondary | ICD-10-CM

## 2023-07-19 DIAGNOSIS — G20B2 Parkinson's disease with dyskinesia, with fluctuations: Secondary | ICD-10-CM

## 2023-07-19 LAB — URINALYSIS, ROUTINE W REFLEX MICROSCOPIC
Bilirubin Urine: NEGATIVE
Glucose, UA: NEGATIVE mg/dL
Ketones, ur: NEGATIVE mg/dL
Leukocytes,Ua: NEGATIVE
Nitrite: NEGATIVE
Protein, ur: NEGATIVE mg/dL
Specific Gravity, Urine: 1.013 (ref 1.005–1.030)
pH: 5 (ref 5.0–8.0)

## 2023-07-19 LAB — CBC
HCT: 30.3 % — ABNORMAL LOW (ref 39.0–52.0)
Hemoglobin: 10 g/dL — ABNORMAL LOW (ref 13.0–17.0)
MCH: 32.8 pg (ref 26.0–34.0)
MCHC: 33 g/dL (ref 30.0–36.0)
MCV: 99.3 fL (ref 80.0–100.0)
Platelets: 333 10*3/uL (ref 150–400)
RBC: 3.05 MIL/uL — ABNORMAL LOW (ref 4.22–5.81)
RDW: 13 % (ref 11.5–15.5)
WBC: 22.9 10*3/uL — ABNORMAL HIGH (ref 4.0–10.5)
nRBC: 0 % (ref 0.0–0.2)

## 2023-07-19 LAB — COMPREHENSIVE METABOLIC PANEL WITH GFR
ALT: 18 U/L (ref 0–44)
AST: 79 U/L — ABNORMAL HIGH (ref 15–41)
Albumin: 2.1 g/dL — ABNORMAL LOW (ref 3.5–5.0)
Alkaline Phosphatase: 85 U/L (ref 38–126)
Anion gap: 10 (ref 5–15)
BUN: 39 mg/dL — ABNORMAL HIGH (ref 8–23)
CO2: 27 mmol/L (ref 22–32)
Calcium: 7.5 mg/dL — ABNORMAL LOW (ref 8.9–10.3)
Chloride: 99 mmol/L (ref 98–111)
Creatinine, Ser: 0.76 mg/dL (ref 0.61–1.24)
GFR, Estimated: >60 mL/min (ref >60)
Glucose, Bld: 130 mg/dL — ABNORMAL HIGH (ref 70–99)
Potassium: 4.4 mmol/L (ref 3.5–5.1)
Sodium: 136 mmol/L (ref 135–145)
Total Bilirubin: 1.5 mg/dL — ABNORMAL HIGH (ref 0.0–1.2)
Total Protein: 6 g/dL — ABNORMAL LOW (ref 6.5–8.1)

## 2023-07-19 LAB — LACTIC ACID, PLASMA: Lactic Acid, Venous: 1.6 mmol/L (ref 0.5–1.9)

## 2023-07-19 LAB — CULTURE, RESPIRATORY W GRAM STAIN

## 2023-07-19 LAB — MAGNESIUM: Magnesium: 2.1 mg/dL (ref 1.7–2.4)

## 2023-07-20 DIAGNOSIS — J9621 Acute and chronic respiratory failure with hypoxia: Secondary | ICD-10-CM

## 2023-07-20 DIAGNOSIS — Z930 Tracheostomy status: Secondary | ICD-10-CM

## 2023-07-20 DIAGNOSIS — I63319 Cerebral infarction due to thrombosis of unspecified middle cerebral artery: Secondary | ICD-10-CM

## 2023-07-20 DIAGNOSIS — G20B2 Parkinson's disease with dyskinesia, with fluctuations: Secondary | ICD-10-CM

## 2023-07-20 LAB — BLOOD CULTURE ID PANEL (REFLEXED) - BCID2

## 2023-07-20 LAB — URINE CULTURE: Culture: NO GROWTH

## 2023-07-21 DIAGNOSIS — J9621 Acute and chronic respiratory failure with hypoxia: Secondary | ICD-10-CM

## 2023-07-21 DIAGNOSIS — N186 End stage renal disease: Secondary | ICD-10-CM

## 2023-07-21 DIAGNOSIS — Z930 Tracheostomy status: Secondary | ICD-10-CM

## 2023-07-21 DIAGNOSIS — I63319 Cerebral infarction due to thrombosis of unspecified middle cerebral artery: Secondary | ICD-10-CM

## 2023-07-21 DIAGNOSIS — G20B2 Parkinson's disease with dyskinesia, with fluctuations: Secondary | ICD-10-CM

## 2023-07-22 DIAGNOSIS — Z930 Tracheostomy status: Secondary | ICD-10-CM

## 2023-07-22 DIAGNOSIS — G20B2 Parkinson's disease with dyskinesia, with fluctuations: Secondary | ICD-10-CM

## 2023-07-22 DIAGNOSIS — I63319 Cerebral infarction due to thrombosis of unspecified middle cerebral artery: Secondary | ICD-10-CM

## 2023-07-22 DIAGNOSIS — J9621 Acute and chronic respiratory failure with hypoxia: Secondary | ICD-10-CM

## 2023-07-22 LAB — CULTURE, RESPIRATORY W GRAM STAIN

## 2023-07-22 LAB — CULTURE, BLOOD (ROUTINE X 2)

## 2023-07-22 LAB — VANCOMYCIN, TROUGH: Vancomycin Tr: 20 ug/mL (ref 15–20)

## 2023-07-23 DEATH — deceased

## 2023-07-24 ENCOUNTER — Institutional Professional Consult (permissible substitution) (HOSPITAL_COMMUNITY): Payer: Self-pay

## 2023-07-24 LAB — CBC WITH DIFFERENTIAL/PLATELET
Abs Immature Granulocytes: 1.1 10*3/uL — ABNORMAL HIGH (ref 0.00–0.07)
Basophils Absolute: 0.1 10*3/uL (ref 0.0–0.1)
Basophils Relative: 0 %
Eosinophils Absolute: 0 10*3/uL (ref 0.0–0.5)
Eosinophils Relative: 0 %
HCT: 29.5 % — ABNORMAL LOW (ref 39.0–52.0)
Hemoglobin: 9.7 g/dL — ABNORMAL LOW (ref 13.0–17.0)
Immature Granulocytes: 4 %
Lymphocytes Relative: 2 %
Lymphs Abs: 0.6 10*3/uL — ABNORMAL LOW (ref 0.7–4.0)
MCH: 33.7 pg (ref 26.0–34.0)
MCHC: 32.9 g/dL (ref 30.0–36.0)
MCV: 102.4 fL — ABNORMAL HIGH (ref 80.0–100.0)
Monocytes Absolute: 2 10*3/uL — ABNORMAL HIGH (ref 0.1–1.0)
Monocytes Relative: 7 %
Neutro Abs: 26.6 10*3/uL — ABNORMAL HIGH (ref 1.7–7.7)
Neutrophils Relative %: 87 %
Platelets: 363 10*3/uL (ref 150–400)
RBC: 2.88 MIL/uL — ABNORMAL LOW (ref 4.22–5.81)
RDW: 13.8 % (ref 11.5–15.5)
WBC: 30.4 10*3/uL — ABNORMAL HIGH (ref 4.0–10.5)
nRBC: 0 % (ref 0.0–0.2)

## 2023-07-24 LAB — BASIC METABOLIC PANEL WITH GFR
Anion gap: 9 (ref 5–15)
BUN: 123 mg/dL — ABNORMAL HIGH (ref 8–23)
CO2: 30 mmol/L (ref 22–32)
Calcium: 7.7 mg/dL — ABNORMAL LOW (ref 8.9–10.3)
Chloride: 102 mmol/L (ref 98–111)
Creatinine, Ser: 1.41 mg/dL — ABNORMAL HIGH (ref 0.61–1.24)
GFR, Estimated: 51 mL/min — ABNORMAL LOW (ref >60)
Glucose, Bld: 238 mg/dL — ABNORMAL HIGH (ref 70–99)
Potassium: 4 mmol/L (ref 3.5–5.1)
Sodium: 141 mmol/L (ref 135–145)

## 2023-07-24 LAB — BLOOD GAS, ARTERIAL
Acid-Base Excess: 8.2 mmol/L — ABNORMAL HIGH (ref 0.0–2.0)
Bicarbonate: 34.4 mmol/L — ABNORMAL HIGH (ref 20.0–28.0)
O2 Saturation: 93.4 %
Patient temperature: 38.1
pCO2 arterial: 56 mmHg — ABNORMAL HIGH (ref 32–48)
pH, Arterial: 7.4 (ref 7.35–7.45)
pO2, Arterial: 69 mmHg — ABNORMAL LOW (ref 83–108)

## 2023-07-24 LAB — CULTURE, BLOOD (ROUTINE X 2)
Culture: NO GROWTH
Special Requests: ADEQUATE

## 2023-07-24 NOTE — Progress Notes (Addendum)
  IR BRIEF NOTE:   IR was requested for percutaneous G-tube placement. Unfortunately, patient's WBC remains elevated at 30.4. IR cannot proceed until patient's WBC has normalized to baseline. Patient is also maintained on IM Lovenox , Aspirin  81 mg, and Brilinta  90 mg. ASA and Brilinta  will preferentially be held for 5 days prior to G-tube placement. Discussed case with Care team, who are amenable to re-consulting IR when patient's situation stabilizes. Order deleted for now. IR remains available.   Electronically Signed: Lovena Rubinstein, PA-C 07/24/2023, 2:34 PM

## 2023-07-25 ENCOUNTER — Institutional Professional Consult (permissible substitution) (HOSPITAL_COMMUNITY): Payer: Self-pay

## 2023-07-25 LAB — COMPREHENSIVE METABOLIC PANEL WITH GFR
ALT: 58 U/L — ABNORMAL HIGH (ref 0–44)
AST: 179 U/L — ABNORMAL HIGH (ref 15–41)
Albumin: <1.5 g/dL — ABNORMAL LOW (ref 3.5–5.0)
Alkaline Phosphatase: 66 U/L (ref 38–126)
Anion gap: 17 — ABNORMAL HIGH (ref 5–15)
BUN: 169 mg/dL — ABNORMAL HIGH (ref 8–23)
CO2: 25 mmol/L (ref 22–32)
Calcium: 7.6 mg/dL — ABNORMAL LOW (ref 8.9–10.3)
Chloride: 101 mmol/L (ref 98–111)
Creatinine, Ser: 2.11 mg/dL — ABNORMAL HIGH (ref 0.61–1.24)
GFR, Estimated: 32 mL/min — ABNORMAL LOW (ref >60)
Glucose, Bld: 180 mg/dL — ABNORMAL HIGH (ref 70–99)
Potassium: 3.9 mmol/L (ref 3.5–5.1)
Sodium: 143 mmol/L (ref 135–145)
Total Bilirubin: 0.8 mg/dL (ref 0.0–1.2)
Total Protein: 5.2 g/dL — ABNORMAL LOW (ref 6.5–8.1)

## 2023-07-25 LAB — CBC
HCT: 25.4 % — ABNORMAL LOW (ref 39.0–52.0)
Hemoglobin: 8 g/dL — ABNORMAL LOW (ref 13.0–17.0)
MCH: 32.7 pg (ref 26.0–34.0)
MCHC: 31.5 g/dL (ref 30.0–36.0)
MCV: 103.7 fL — ABNORMAL HIGH (ref 80.0–100.0)
Platelets: 290 10*3/uL (ref 150–400)
RBC: 2.45 MIL/uL — ABNORMAL LOW (ref 4.22–5.81)
RDW: 13.9 % (ref 11.5–15.5)
WBC: 25.2 10*3/uL — ABNORMAL HIGH (ref 4.0–10.5)
nRBC: 0.1 % (ref 0.0–0.2)

## 2023-07-25 LAB — MAGNESIUM: Magnesium: 3.2 mg/dL — ABNORMAL HIGH (ref 1.7–2.4)

## 2023-08-22 DEATH — deceased

## 2023-10-31 MED FILL — Medication: Qty: 1 | Status: AC
